# Patient Record
Sex: Female | Born: 1970
Health system: Southern US, Community
[De-identification: ages and names within clinical notes are randomized; demographics above are authoritative.]

## PROBLEM LIST (undated history)

## (undated) DIAGNOSIS — T7840XA Allergy, unspecified, initial encounter: Secondary | ICD-10-CM

## (undated) DIAGNOSIS — E041 Nontoxic single thyroid nodule: Secondary | ICD-10-CM

## (undated) DIAGNOSIS — J309 Allergic rhinitis, unspecified: Secondary | ICD-10-CM

## (undated) DIAGNOSIS — F988 Other specified behavioral and emotional disorders with onset usually occurring in childhood and adolescence: Secondary | ICD-10-CM

## (undated) DIAGNOSIS — M419 Scoliosis, unspecified: Secondary | ICD-10-CM

## (undated) HISTORY — DX: Allergy, unspecified, initial encounter: T78.40XA

## (undated) HISTORY — DX: Other specified behavioral and emotional disorders with onset usually occurring in childhood and adolescence: F98.8

## (undated) HISTORY — DX: Allergic rhinitis, unspecified: J30.9

## (undated) HISTORY — DX: Scoliosis, unspecified: M41.9

---
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
## 2002-08-02 ENCOUNTER — Other Ambulatory Visit: Admission: RE | Admit: 2002-08-02 | Discharge: 2002-08-02 | Payer: Self-pay | Admitting: Obstetrics and Gynecology

## 2003-02-23 ENCOUNTER — Inpatient Hospital Stay (HOSPITAL_COMMUNITY): Admission: AD | Admit: 2003-02-23 | Discharge: 2003-02-25 | Payer: Self-pay | Admitting: Obstetrics and Gynecology

## 2003-02-23 HISTORY — PX: OTHER SURGICAL HISTORY: SHX169

## 2003-03-30 ENCOUNTER — Other Ambulatory Visit: Admission: RE | Admit: 2003-03-30 | Discharge: 2003-03-30 | Payer: Self-pay | Admitting: Obstetrics and Gynecology

## 2004-07-13 ENCOUNTER — Other Ambulatory Visit: Admission: RE | Admit: 2004-07-13 | Discharge: 2004-07-13 | Payer: Self-pay | Admitting: Obstetrics and Gynecology

## 2004-12-19 ENCOUNTER — Ambulatory Visit: Payer: Self-pay | Admitting: Internal Medicine

## 2006-01-03 ENCOUNTER — Ambulatory Visit: Payer: Self-pay | Admitting: Internal Medicine

## 2006-01-03 LAB — CONVERTED CEMR LAB
Albumin: 4.4 g/dL (ref 3.5–5.2)
Basophils Absolute: 0 10*3/uL (ref 0.0–0.1)
Bilirubin Urine: NEGATIVE
CO2: 27 meq/L (ref 19–32)
Calcium: 9.6 mg/dL (ref 8.4–10.5)
Chloride: 106 meq/L (ref 96–112)
Cholesterol: 138 mg/dL (ref 0–200)
Creatinine, Ser: 0.8 mg/dL (ref 0.4–1.2)
Glomerular Filtration Rate, Af Am: 106 mL/min/{1.73_m2}
Glucose, Bld: 91 mg/dL (ref 70–99)
Hemoglobin: 14 g/dL (ref 12.0–15.0)
Leukocytes, UA: NEGATIVE
Lymphocytes Relative: 21.8 % (ref 12.0–46.0)
MCHC: 32.2 g/dL (ref 30.0–36.0)
MCV: 93.3 fL (ref 78.0–100.0)
Monocytes Absolute: 0.5 10*3/uL (ref 0.2–0.7)
Monocytes Relative: 8.4 % (ref 3.0–11.0)
Neutrophils Relative %: 68.5 % (ref 43.0–77.0)
Nitrite: NEGATIVE
Platelets: 257 10*3/uL (ref 150–400)
Specific Gravity, Urine: >=1.03 (ref 1.000–1.03)
TSH: 5.24 microintl units/mL (ref 0.35–5.50)
Total Protein, Urine: NEGATIVE mg/dL
Triglyceride fasting, serum: 68 mg/dL (ref 0–149)

## 2006-01-07 ENCOUNTER — Ambulatory Visit: Payer: Self-pay | Admitting: Internal Medicine

## 2010-03-17 ENCOUNTER — Emergency Department: Payer: Self-pay | Admitting: Emergency Medicine

## 2010-10-02 ENCOUNTER — Telehealth: Payer: Self-pay

## 2010-10-02 DIAGNOSIS — Z0000 Encounter for general adult medical examination without abnormal findings: Secondary | ICD-10-CM

## 2010-10-02 NOTE — Telephone Encounter (Signed)
Put order in for labs. 

## 2010-11-05 ENCOUNTER — Encounter: Payer: Self-pay | Admitting: Internal Medicine

## 2010-11-05 DIAGNOSIS — Z0000 Encounter for general adult medical examination without abnormal findings: Secondary | ICD-10-CM | POA: Insufficient documentation

## 2010-11-05 DIAGNOSIS — Z0001 Encounter for general adult medical examination with abnormal findings: Secondary | ICD-10-CM | POA: Insufficient documentation

## 2010-11-08 ENCOUNTER — Encounter: Payer: Self-pay | Admitting: Internal Medicine

## 2010-11-08 ENCOUNTER — Other Ambulatory Visit (INDEPENDENT_AMBULATORY_CARE_PROVIDER_SITE_OTHER): Payer: 59

## 2010-11-08 ENCOUNTER — Ambulatory Visit (INDEPENDENT_AMBULATORY_CARE_PROVIDER_SITE_OTHER): Payer: 59 | Admitting: Internal Medicine

## 2010-11-08 VITALS — BP 94/62 | HR 70 | Temp 99.1°F | Ht 61.0 in | Wt 148.2 lb

## 2010-11-08 DIAGNOSIS — Z23 Encounter for immunization: Secondary | ICD-10-CM

## 2010-11-08 DIAGNOSIS — Z0000 Encounter for general adult medical examination without abnormal findings: Secondary | ICD-10-CM

## 2010-11-08 DIAGNOSIS — J309 Allergic rhinitis, unspecified: Secondary | ICD-10-CM

## 2010-11-08 HISTORY — DX: Allergic rhinitis, unspecified: J30.9

## 2010-11-08 LAB — BASIC METABOLIC PANEL
Calcium: 9.2 mg/dL (ref 8.4–10.5)
Creatinine, Ser: 0.7 mg/dL (ref 0.4–1.2)
GFR: 102.36 mL/min (ref >60.00)
Glucose, Bld: 92 mg/dL (ref 70–99)
Sodium: 139 mEq/L (ref 135–145)

## 2010-11-08 LAB — URINALYSIS, ROUTINE W REFLEX MICROSCOPIC
Ketones, ur: NEGATIVE
Specific Gravity, Urine: >=1.03 (ref 1.000–1.030)
Total Protein, Urine: NEGATIVE
Urine Glucose: NEGATIVE
pH: 6 (ref 5.0–8.0)

## 2010-11-08 LAB — CBC WITH DIFFERENTIAL/PLATELET
Basophils Relative: 0.4 % (ref 0.0–3.0)
Eosinophils Relative: 1 % (ref 0.0–5.0)
HCT: 43.2 % (ref 36.0–46.0)
Hemoglobin: 14.3 g/dL (ref 12.0–15.0)
Lymphocytes Relative: 25.9 % (ref 12.0–46.0)
Lymphs Abs: 2 10*3/uL (ref 0.7–4.0)
Monocytes Relative: 7.8 % (ref 3.0–12.0)
Neutro Abs: 5 10*3/uL (ref 1.4–7.7)
RBC: 4.53 Mil/uL (ref 3.87–5.11)
RDW: 13.8 % (ref 11.5–14.6)
WBC: 7.7 10*3/uL (ref 4.5–10.5)

## 2010-11-08 LAB — HEPATIC FUNCTION PANEL
Albumin: 4.5 g/dL (ref 3.5–5.2)
Alkaline Phosphatase: 54 U/L (ref 39–117)
Total Protein: 7.7 g/dL (ref 6.0–8.3)

## 2010-11-08 LAB — LIPID PANEL
Cholesterol: 160 mg/dL (ref 0–200)
VLDL: 32 mg/dL (ref 0.0–40.0)

## 2010-11-08 MED ORDER — TETANUS-DIPHTH-ACELL PERTUSSIS 5-2.5-18.5 LF-MCG/0.5 IM SUSP
0.5000 mL | Freq: Once | INTRAMUSCULAR | Status: AC
  Administered 2010-11-08: 0.5 mL via INTRAMUSCULAR

## 2010-11-08 NOTE — Patient Instructions (Signed)
You had the tetanus shot today Please go to LAB in the Basement for the blood and/or urine tests to be done today Please call the phone number [phone] (the [employer] System) for results of testing in 2-3 days;  When calling, simply dial the number, and when prompted enter the MRN number above (the Medical Record Number) and the # key, then the message should start.

## 2010-11-08 NOTE — Progress Notes (Signed)
Subjective:    Patient ID: Chelsea Ortiz, female    DOB: [birth year], 40 y.o.   MRN: [id number]  HPI  Here for wellness and f/u;  Overall doing ok;  Pt denies CP, worsening SOB, DOE, wheezing, orthopnea, PND, worsening LE edema, palpitations, dizziness or syncope.  Pt denies neurological change such as new Headache, facial or extremity weakness.  Pt denies polydipsia, polyuria, or low sugar symptoms. Pt states overall good compliance with treatment and medications, good tolerability, and trying to follow lower cholesterol diet.  Pt denies worsening depressive symptoms, suicidal ideation or panic. No fever, wt loss, night sweats, loss of appetite, or other constitutional symptoms.  Pt states good ability with ADL's, low fall risk, home safety reviewed and adequate, no significant changes in hearing or vision, and occasionally active with exercise.  Has known scoliosis and some ongoing back pain.  Has gained some wt over the past 5 yrs.   Husband an boss suggested she might have ADD, and she agrees, and does not want meds.  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 No past medical history on file. Past Surgical History  Procedure Date  . Tonsillectomy 1982  . Child birth [birth year]/23[birth year]2003    reports that she has been smoking.  She does not have any smokeless tobacco history on file. She reports that she does not drink alcohol or use illicit drugs. family history includes Heart disease in her other and Hypertension in her other. Allergies  Allergen Reactions  . Penicillins     hives   No current outpatient prescriptions on file prior to visit.   Review of Systems Review of Systems  Constitutional: Negative for diaphoresis, activity change, appetite change and unexpected weight change.  HENT: Negative for hearing loss, ear pain, facial swelling, mouth sores and neck stiffness.   Eyes: Negative for  pain, redness and visual disturbance.  Respiratory: Negative for shortness of breath and wheezing.   Cardiovascular: Negative for chest pain and palpitations.  Gastrointestinal: Negative for diarrhea, blood in stool, abdominal distention and rectal pain.  Genitourinary: Negative for hematuria, flank pain and decreased urine volume.  Musculoskeletal: Negative for myalgias and joint swelling.  Skin: Negative for color change and wound.  Neurological: Negative for syncope and numbness.  Hematological: Negative for adenopathy.  Psychiatric/Behavioral: Negative for hallucinations, self-injury, decreased concentration and agitation.     Objective:   Physical Exam BP 94/62  Pulse 70  Temp(Src) 99.1 F (37.3 C) (Oral)  Ht 5\' 1"  (1.549 m)  Wt 148 lb 4 oz (67.246 kg)  BMI 28.01 kg/m2  SpO2 97% Physical Exam  VS noted Constitutional: Pt is oriented to person, place, and time. Appears well-developed and well-nourished.  HENT:  Head: Normocephalic and atraumatic.  Right Ear: External ear normal.  Left Ear: External ear normal.  Nose: Nose normal.  Mouth/Throat: Oropharynx is clear and moist.  Eyes: Conjunctivae and EOM are normal. Pupils are equal, round, and reactive to light.  Neck: Normal range of motion. Neck supple. No JVD present. No tracheal deviation present.  Cardiovascular: Normal rate, regular rhythm, normal heart sounds and intact distal pulses.   Pulmonary/Chest: Effort normal and breath sounds normal.  Abdominal: Soft. Bowel sounds are normal. There is no tenderness.  Musculoskeletal: Normal range of motion. Exhibits no edema.  Lymphadenopathy:  Has no cervical adenopathy.  Neurological: Pt is alert and oriented to person, place, and  time. Pt has normal reflexes. No cranial nerve deficit.  Skin: Skin is warm and dry. No rash noted.  Psychiatric:  Has  normal mood and affect. Behavior is normal.     Assessment & Plan:

## 2010-11-08 NOTE — Assessment & Plan Note (Signed)
Overall doing well, age appropriate education and counseling updated, referrals for preventative services and immunizations addressed, dietary and smoking counseling addressed, most recent labs and ECG reviewed.  I have personally reviewed and have noted: 1) the patient's medical and social history 2) The pt's use of alcohol, tobacco, and illicit drugs 3) The patient's current medications and supplements 4) Functional ability including ADL's, fall risk, home safety risk, hearing and visual impairment 5) Diet and physical activities 6) Evidence for depression or mood disorder 7) The patient's height, weight, and BMI have been recorded in the chart I have made referrals, and provided counseling and education based on review of the above Declines flu shot for now, ok for tetanus

## 2011-04-22 ENCOUNTER — Encounter: Payer: Self-pay | Admitting: Internal Medicine

## 2011-04-22 ENCOUNTER — Ambulatory Visit (INDEPENDENT_AMBULATORY_CARE_PROVIDER_SITE_OTHER): Payer: 59 | Admitting: Internal Medicine

## 2011-04-22 VITALS — BP 110/78 | HR 72 | Temp 99.1°F | Ht 61.0 in | Wt 151.2 lb

## 2011-04-22 DIAGNOSIS — M4120 Other idiopathic scoliosis, site unspecified: Secondary | ICD-10-CM

## 2011-04-22 DIAGNOSIS — M419 Scoliosis, unspecified: Secondary | ICD-10-CM

## 2011-04-22 DIAGNOSIS — F988 Other specified behavioral and emotional disorders with onset usually occurring in childhood and adolescence: Secondary | ICD-10-CM

## 2011-04-22 DIAGNOSIS — F411 Generalized anxiety disorder: Secondary | ICD-10-CM

## 2011-04-22 DIAGNOSIS — F419 Anxiety disorder, unspecified: Secondary | ICD-10-CM | POA: Insufficient documentation

## 2011-04-22 HISTORY — DX: Other specified behavioral and emotional disorders with onset usually occurring in childhood and adolescence: F98.8

## 2011-04-22 HISTORY — DX: Scoliosis, unspecified: M41.9

## 2011-04-22 MED ORDER — METHYLPHENIDATE HCL ER (OSM) 18 MG PO TBCR: 18.0000 mg | EXTENDED_RELEASE_TABLET | ORAL | Status: DC

## 2011-04-22 NOTE — Patient Instructions (Signed)
Take all new medications as prescribed Continue all other medications as before Please call or have the pharmacy call if you need further refills Please call if you would want to start the lexapro 10 mg per day

## 2011-04-22 NOTE — Assessment & Plan Note (Signed)
Mild to mod, declines counseling, to consider lexapro 10 qd,  to f/u any worsening symptoms or concerns

## 2011-04-22 NOTE — Progress Notes (Signed)
  Subjective:    Patient ID: Chelsea Ortiz, female    DOB: 06/30/1970, 41 y.o.   MRN: [id number]  HPI  Here to f/u with ongoing lifelong difficulty with attention and task completion, mild overall but to the point now that she finds it more and more difficult to keep at work, though also states it is not overly demanding or high stress, and not in trouble at work.  Denies worsening depressive symptoms, suicidal ideation, or panic, though has ongoing anxiety, at least mild as well.  Pt denies chest pain, increased sob or doe, wheezing, orthopnea, PND, increased LE swelling, palpitations, dizziness or syncope.  Pt denies new neurological symptoms such as new headache, or facial or extremity weakness or numbness   Pt denies polydipsia, polyuria.  Does also have mild ongoing pain to mid left back assoc with scoliosis, just wanted to mention, is fearful of becomiing more kyphoscolitic in the future like her mother but has not noticed increased pain, and no bowel or bladder change, fever, wt loss,  worsening LE pain/numbness/weakness, gait change or falls.  Past Medical History  Diagnosis Date  . Allergic rhinitis, cause unspecified 11/08/2010   Past Surgical History  Procedure Date  . Tonsillectomy 1982  . Child birth [birth year]2003    reports that she has been smoking.  She does not have any smokeless tobacco history on file. She reports that she does not drink alcohol or use illicit drugs. family history includes Heart disease in her other and Hypertension in her other. Allergies  Allergen Reactions  . Penicillins     hives   No current outpatient prescriptions on file prior to visit.   Review of Systems Review of Systems  Constitutional: Negative for diaphoresis and unexpected weight change.  HENT: Negative for drooling and tinnitus.   Eyes: Negative for photophobia and visual disturbance.  Respiratory: Negative for choking and stridor.   Gastrointestinal: Negative for vomiting and blood in  stool.  Genitourinary: Negative for hematuria and decreased urine volume.  Musculoskeletal: Negative for gait problem.      Objective:   Physical Exam BP 110/78  Pulse 72  Temp(Src) 99.1 F (37.3 C) (Oral)  Ht 5\' 1"  (1.549 m)  Wt 151 lb 4 oz (68.607 kg)  BMI 28.58 kg/m2  SpO2 96% Physical Exam  VS noted, not ill appearing Constitutional: Pt appears well-developed and well-nourished.  HENT: Head: Normocephalic.  Right Ear: External ear normal.  Left Ear: External ear normal.  Eyes: Conjunctivae and EOM are normal. Pupils are equal, round, and reactive to light.  Neck: Normal range of motion. Neck supple.  Cardiovascular: Normal rate and regular rhythm.   Pulmonary/Chest: Effort normal and breath sounds normal.  Neurological: Pt is alert. No cranial nerve deficit.  Skin: Skin is warm. No erythema.  Spine with leftward scoliiosis palpable mid thoracic, with some paravertebral tender without erythema, rash, swelling Psychiatric: Pt behavior is normal. Thought content normal.     Assessment & Plan:

## 2011-04-22 NOTE — Assessment & Plan Note (Signed)
Mild now, mild symptomatic, no specific tx, but if worsens I suggested referral to [facility]

## 2011-04-22 NOTE — Assessment & Plan Note (Signed)
Mild, for low dose concerta generic,  to f/u any worsening symptoms or concerns

## 2011-05-17 ENCOUNTER — Other Ambulatory Visit: Payer: Self-pay

## 2011-05-17 MED ORDER — METHYLPHENIDATE HCL ER (OSM) 18 MG PO TBCR: 18.0000 mg | EXTENDED_RELEASE_TABLET | ORAL | Status: DC

## 2011-05-17 NOTE — Telephone Encounter (Signed)
Not sure what name of med for refill is based on this note

## 2011-05-17 NOTE — Telephone Encounter (Signed)
Done hardcopy to robin  

## 2011-05-17 NOTE — Telephone Encounter (Signed)
Patient informed prescription is ready for pickup at front desk 

## 2011-05-17 NOTE — Telephone Encounter (Signed)
Patient is requesting a refill on concerta.

## 2011-05-21 ENCOUNTER — Telehealth: Payer: Self-pay

## 2011-05-21 NOTE — Telephone Encounter (Signed)
The patient has picked up her concerta prescription but has not yet filled. She would like to know if there is an alternative prescription that would not make her sex drive " non existence" as she stated. She can return the concerta prescription if alternative is written. Please advise as patient is concerned. Call back number is [phone]

## 2011-05-21 NOTE — Telephone Encounter (Signed)
I have reviewed again the side effect, and I confirmed that female sexual dysfunction is not one of reported prior side effects  Female sexual dysfunction is common, sometimes more than one cause, and very difficult to understand, much less treat  OK to take the concerta for now  Please consider seeing GYN for evaluation

## 2011-05-21 NOTE — Telephone Encounter (Signed)
Patient informed. 

## 2011-05-21 NOTE — Telephone Encounter (Signed)
Called left message to call back 

## 2011-06-24 ENCOUNTER — Other Ambulatory Visit: Payer: Self-pay

## 2011-06-24 NOTE — Telephone Encounter (Signed)
ok 

## 2011-06-24 NOTE — Telephone Encounter (Signed)
Patient requesting a refill on Concerta. Call [phone] when ready for pickup

## 2011-06-25 MED ORDER — METHYLPHENIDATE HCL ER (OSM) 18 MG PO TBCR: 18.0000 mg | EXTENDED_RELEASE_TABLET | ORAL | Status: DC

## 2011-06-25 NOTE — Telephone Encounter (Signed)
Called the patient informed prescription requested is ready for pickup at the front desk. 

## 2011-08-06 ENCOUNTER — Other Ambulatory Visit: Payer: Self-pay

## 2011-08-06 MED ORDER — METHYLPHENIDATE HCL ER (OSM) 18 MG PO TBCR: 18.0000 mg | EXTENDED_RELEASE_TABLET | ORAL | Status: DC

## 2011-08-06 NOTE — Telephone Encounter (Signed)
Patient informed to pickup prescription requested in cabnet at the front desk.

## 2011-08-06 NOTE — Telephone Encounter (Signed)
Done hardcopy to robin  

## 2011-09-09 ENCOUNTER — Other Ambulatory Visit: Payer: Self-pay

## 2011-09-09 MED ORDER — METHYLPHENIDATE HCL ER (OSM) 18 MG PO TBCR: 18.0000 mg | EXTENDED_RELEASE_TABLET | ORAL | Status: DC

## 2011-09-09 NOTE — Telephone Encounter (Signed)
Done hardcopy to robin  

## 2011-09-09 NOTE — Telephone Encounter (Signed)
Called the patient informed prescription requested is ready for pickup at the front desk. 

## 2011-10-14 ENCOUNTER — Telehealth: Payer: Self-pay | Admitting: Internal Medicine

## 2011-10-14 MED ORDER — METHYLPHENIDATE HCL ER (OSM) 18 MG PO TBCR: 18.0000 mg | EXTENDED_RELEASE_TABLET | ORAL | Status: DC

## 2011-10-14 NOTE — Telephone Encounter (Signed)
Done hardcopy to robin  

## 2011-10-14 NOTE — Telephone Encounter (Signed)
Pt needs refill on Concerta, call when ready to pick up

## 2011-10-15 NOTE — Telephone Encounter (Signed)
Called the patient left detailed message that prescription requested is ready for pickup at the front desk. 

## 2011-11-14 ENCOUNTER — Telehealth: Payer: Self-pay | Admitting: Internal Medicine

## 2011-11-14 NOTE — Telephone Encounter (Signed)
Patient is requesting refill on concerta, call when ready to pick up

## 2011-11-15 MED ORDER — METHYLPHENIDATE HCL ER (OSM) 18 MG PO TBCR: 18.0000 mg | EXTENDED_RELEASE_TABLET | ORAL | Status: DC

## 2011-11-15 NOTE — Telephone Encounter (Signed)
Called the patient left detailed message that prescription requested is ready for pickup at the front desk. 

## 2011-11-15 NOTE — Telephone Encounter (Signed)
Done hardcopy to robin  

## 2011-12-17 ENCOUNTER — Other Ambulatory Visit: Payer: Self-pay

## 2011-12-17 MED ORDER — METHYLPHENIDATE HCL ER (OSM) 18 MG PO TBCR: 18.0000 mg | EXTENDED_RELEASE_TABLET | ORAL | Status: DC

## 2011-12-17 NOTE — Telephone Encounter (Signed)
Called informed the patient to pickup hardcopy at the front desk. 

## 2011-12-17 NOTE — Telephone Encounter (Signed)
Done hardcopy to robin  

## 2012-01-20 ENCOUNTER — Other Ambulatory Visit: Payer: Self-pay | Admitting: *Deleted

## 2012-01-20 MED ORDER — METHYLPHENIDATE HCL ER (OSM) 18 MG PO TBCR: 18.0000 mg | EXTENDED_RELEASE_TABLET | ORAL | Status: DC

## 2012-01-20 NOTE — Telephone Encounter (Signed)
Done hardcopy to robin  

## 2012-01-20 NOTE — Telephone Encounter (Signed)
Called the patient left detailed message prescription requested is ready for pickup at the front desk.

## 2012-01-20 NOTE — Telephone Encounter (Signed)
Pt requesting refill of Concerta-last written 12/17/2011 #30 with 0 refills-please advise.

## 2012-02-20 ENCOUNTER — Ambulatory Visit (INDEPENDENT_AMBULATORY_CARE_PROVIDER_SITE_OTHER): Payer: 59 | Admitting: Internal Medicine

## 2012-02-20 ENCOUNTER — Encounter: Payer: Self-pay | Admitting: Internal Medicine

## 2012-02-20 VITALS — BP 108/80 | HR 84 | Temp 99.0°F | Ht 62.0 in | Wt 144.2 lb

## 2012-02-20 DIAGNOSIS — R21 Rash and other nonspecific skin eruption: Secondary | ICD-10-CM | POA: Insufficient documentation

## 2012-02-20 DIAGNOSIS — Z0000 Encounter for general adult medical examination without abnormal findings: Secondary | ICD-10-CM

## 2012-02-20 DIAGNOSIS — F988 Other specified behavioral and emotional disorders with onset usually occurring in childhood and adolescence: Secondary | ICD-10-CM

## 2012-02-20 MED ORDER — TRIAMCINOLONE ACETONIDE 0.1 % EX CREA: TOPICAL_CREAM | Freq: Two times a day (BID) | CUTANEOUS | Status: DC

## 2012-02-20 MED ORDER — AMPHETAMINE-DEXTROAMPHET ER 10 MG PO CP24: ORAL_CAPSULE | ORAL | Status: DC

## 2012-02-20 NOTE — Assessment & Plan Note (Signed)
Ok to try change adderall ER 20 bid

## 2012-02-20 NOTE — Progress Notes (Signed)
  Subjective:    Patient ID: Chelsea Ortiz, female    DOB: 1970-12-31, 42 y.o.   MRN: [id number]  HPI    Here with c/o concerta now increased over $60 per month, can no longer afford though working well for ADD, with good work and social function.  Also with itchy rash to distal right foot/middle toes that gets better and worse with itching and scratching, may have started 6 mo ago after a ? Tick bite between 2 of the toes.  No fever, red streaks, swelling or drainage Past Medical History  Diagnosis Date  . Allergic rhinitis, cause unspecified 11/08/2010  . ADD (attention deficit disorder) 04/22/2011  . Scoliosis of thoracic spine 04/22/2011   Past Surgical History  Procedure Date  . Tonsillectomy 1982  . Child birth [birth year]2003    reports that she has been smoking.  She does not have any smokeless tobacco history on file. She reports that she does not drink alcohol or use illicit drugs. family history includes Heart disease in her other and Hypertension in her other. Allergies  Allergen Reactions  . Penicillins     hives   Current Outpatient Prescriptions on File Prior to Visit  Medication Sig Dispense Refill  . amphetamine-dextroamphetamine (ADDERALL XR) 10 MG 24 hr capsule 1 tab by mouth twice per day at 8 am and noon  60 capsule  0  . methylphenidate (CONCERTA) 18 MG CR tablet Take 1 tablet (18 mg total) by mouth every morning.  30 tablet  0   Review of Systems All otherwise neg per pt     Objective:   Physical Exam BP 108/80  Pulse 84  Temp 99 F (37.2 C) (Oral)  Ht 5\' 2"  (1.575 m)  Wt 144 lb 4 oz (65.431 kg)  BMI 26.38 kg/m2  SpO2 99% VS noted,  Constitutional: Pt appears well-developed and well-nourished.  HENT: Head: NCAT.  Right Ear: External ear normal.  Left Ear: External ear normal.  Eyes: Conjunctivae and EOM are normal. Pupils are equal, round, and reactive to light.  Neck: Normal range of motion. Neck supple.  Cardiovascular: Normal rate and regular rhythm.     Pulmonary/Chest: Effort normal and breath sounds normal.  Neurological: Pt is alert. Not confused  Skin: with discrete area itchy plaque like rash to dorsal toes and distal foot Psychiatric: Pt behavior is normal. Thought content normal.     Assessment & Plan:

## 2012-02-20 NOTE — Assessment & Plan Note (Signed)
C/w dermatitis, for steroid cr prn,  to f/u any worsening symptoms or concerns

## 2012-02-20 NOTE — Patient Instructions (Addendum)
OK to stop the concerta Ok to start the Adderall ER 10 mg twice per day Please continue all other medications as before, and refills have been done if requested. Thank you for enrolling in MyChart. Please follow the instructions below to securely access your online medical record. MyChart allows you to send messages to your doctor, view your test results, renew your prescriptions, schedule appointments, and more. To Log into My Chart online, please go by [employer] or [employer] to [email or web address], or download the MyChart App from the [employer] of [employer] .  Your Username is: [email or web address] (pass [employer]) Please send a Engineer, water on Mychart later today.

## 2012-02-26 ENCOUNTER — Telehealth: Payer: Self-pay

## 2012-02-26 NOTE — Telephone Encounter (Signed)
Received PA Approval for Adderall XR  Until 02/25/13.  Pharmacy and patient informed.

## 2012-02-26 NOTE — Telephone Encounter (Signed)
A user error has taken place: encounter opened in error, closed for administrative reasons.

## 2012-03-12 ENCOUNTER — Ambulatory Visit (INDEPENDENT_AMBULATORY_CARE_PROVIDER_SITE_OTHER): Payer: 59 | Admitting: Internal Medicine

## 2012-03-12 ENCOUNTER — Encounter: Payer: Self-pay | Admitting: Internal Medicine

## 2012-03-12 VITALS — BP 106/70 | HR 97 | Temp 99.2°F | Ht 61.0 in | Wt 140.5 lb

## 2012-03-12 DIAGNOSIS — J309 Allergic rhinitis, unspecified: Secondary | ICD-10-CM

## 2012-03-12 DIAGNOSIS — J209 Acute bronchitis, unspecified: Secondary | ICD-10-CM | POA: Insufficient documentation

## 2012-03-12 DIAGNOSIS — R062 Wheezing: Secondary | ICD-10-CM | POA: Insufficient documentation

## 2012-03-12 MED ORDER — HYDROCODONE-HOMATROPINE 5-1.5 MG/5ML PO SYRP: 5.0000 mL | ORAL_SOLUTION | Freq: Four times a day (QID) | ORAL | Status: DC | PRN

## 2012-03-12 MED ORDER — PREDNISONE 10 MG PO TABS: ORAL_TABLET | ORAL | Status: DC

## 2012-03-12 MED ORDER — LEVOFLOXACIN 250 MG PO TABS: 250.0000 mg | ORAL_TABLET | Freq: Every day | ORAL | Status: DC

## 2012-03-12 NOTE — Assessment & Plan Note (Signed)
Mild, for low dose predpack asd,  to f/u any worsening symptoms or concerns

## 2012-03-12 NOTE — Assessment & Plan Note (Signed)
Mild, likely to improve with predpack asd,  to f/u any worsening symptoms or concerns

## 2012-03-12 NOTE — Assessment & Plan Note (Signed)
Mild to mod, for antibx course,  to f/u any worsening symptoms or concerns 

## 2012-03-12 NOTE — Progress Notes (Signed)
  Subjective:    Patient ID: Chelsea Ortiz, female    DOB: 11/15/1970, 42 y.o.   MRN: [id number]  HPI  Here with acute onset mild to mod 2-3 days ST, HA, general weakness and malaise, with prod cough greenish sputum, but Pt denies chest pain, increased sob or doe, wheezing, orthopnea, PND, increased LE swelling, palpitations, dizziness or syncope, except for onset mild wheezing/sob for 1-2 days.  Pt denies polydipsia, polyuria.  Does have several wks ongoing nasal allergy symptoms with clearish congestion, itch and sneezing, without fever, pain, ST, but overall mild.  Past Medical History  Diagnosis Date  . Allergic rhinitis, cause unspecified 11/08/2010  . ADD (attention deficit disorder) 04/22/2011  . Scoliosis of thoracic spine 04/22/2011   Past Surgical History  Procedure Date  . Tonsillectomy 1982  . Child birth [birth year]2003    reports that she has been smoking.  She does not have any smokeless tobacco history on file. She reports that she does not drink alcohol or use illicit drugs. family history includes Heart disease in her other and Hypertension in her other. Allergies  Allergen Reactions  . Penicillins     hives   Current Outpatient Prescriptions on File Prior to Visit  Medication Sig Dispense Refill  . amphetamine-dextroamphetamine (ADDERALL XR) 10 MG 24 hr capsule 1 tab by mouth twice per day at 8 am and noon  60 capsule  0  . methylphenidate (CONCERTA) 18 MG CR tablet Take 1 tablet (18 mg total) by mouth every morning.  30 tablet  0  . triamcinolone cream (KENALOG) 0.1 % Apply topically 2 (two) times daily.  30 g  0   Review of Systems  Constitutional: Negative for unexpected weight change, or unusual diaphoresis  HENT: Negative for tinnitus.   Eyes: Negative for photophobia and visual disturbance.  Respiratory: Negative for choking and stridor.   Gastrointestinal: Negative for vomiting and blood in stool.  Genitourinary: Negative for hematuria and decreased urine volume.   Musculoskeletal: Negative for acute joint swelling Skin: Negative for color change and wound.  Neurological: Negative for tremors and numbness other than noted  Psychiatric/Behavioral: Negative for decreased concentration or  hyperactivity.       Objective:   Physical Exam BP 106/70  Pulse 97  Temp 99.2 F (37.3 C) (Oral)  Ht 5\' 1"  (1.549 m)  Wt 140 lb 8 oz (63.73 kg)  BMI 26.55 kg/m2  SpO2 97% VS noted, mild ill Constitutional: Pt appears well-developed and well-nourished.  HENT: Head: NCAT.  Right Ear: External ear normal.  Left Ear: External ear normal. Bilat tm's with mild erythema.  Max sinus areas non tender.  Pharynx with mild erythema, no exudate  Eyes: Conjunctivae and EOM are normal. Pupils are equal, round, and reactive to light.  Neck: Normal range of motion. Neck supple.  Cardiovascular: Normal rate and regular rhythm.   Pulmonary/Chest: Effort normal and breath sounds mild decreased, few wheeze bilat Neurological: Pt is alert. Not confused  Skin: Skin is warm. No erythema.  Psychiatric: Pt behavior is normal. Thought content normal.     Assessment & Plan:

## 2012-03-12 NOTE — Patient Instructions (Addendum)
Please take all new medication as prescribed - the antibiotic, cough medicine, and prednisone Please continue all other medications as before

## 2012-03-21 ENCOUNTER — Other Ambulatory Visit: Payer: Self-pay

## 2012-03-23 ENCOUNTER — Ambulatory Visit (INDEPENDENT_AMBULATORY_CARE_PROVIDER_SITE_OTHER): Payer: 59 | Admitting: Internal Medicine

## 2012-03-23 ENCOUNTER — Encounter: Payer: Self-pay | Admitting: Internal Medicine

## 2012-03-23 ENCOUNTER — Ambulatory Visit (INDEPENDENT_AMBULATORY_CARE_PROVIDER_SITE_OTHER)
Admission: RE | Admit: 2012-03-23 | Discharge: 2012-03-23 | Disposition: A | Discharge status: Home / Self Care | Payer: 59 | Source: Ambulatory Visit | Attending: Internal Medicine | Admitting: Internal Medicine

## 2012-03-23 VITALS — BP 110/72 | HR 82 | Temp 98.9°F | Ht 61.0 in | Wt 142.0 lb

## 2012-03-23 DIAGNOSIS — R0602 Shortness of breath: Secondary | ICD-10-CM

## 2012-03-23 DIAGNOSIS — R05 Cough: Secondary | ICD-10-CM

## 2012-03-23 MED ORDER — AZITHROMYCIN 250 MG PO TABS: ORAL_TABLET | ORAL | Status: DC

## 2012-03-23 MED ORDER — PHENYLEPH-PROMETHAZINE-COD 5-6.25-10 MG/5ML PO SYRP: 5.0000 mL | ORAL_SOLUTION | Freq: Every evening | ORAL | Status: DC | PRN

## 2012-03-23 NOTE — Progress Notes (Signed)
HPI  Pt presents to the clinic today with c/o fatigue and cough x 2 weeks. She was seen by Dr. Jonny Ruiz on 03/12/12 and diagnosed with bronchitis. She was given Levaquin and cough syrup but is not any better. In fact the cough is worse. She is not getting any sleep at night. She has been running intermittent fevers. She is concerned that she may have pneumonia. She has had sick contacts.  Review of Systems      Past Medical History  Diagnosis Date  . Allergic rhinitis, cause unspecified 11/08/2010  . ADD (attention deficit disorder) 04/22/2011  . Scoliosis of thoracic spine 04/22/2011    Family History  Problem Relation Age of Onset  . Heart disease Other   . Hypertension Other     History   Social History  . Marital Status: Married    Spouse Name: N/A    Number of Children: N/A  . Years of Education: 14   Occupational History  . rental cord.    Social History Main Topics  . Smoking status: Current Every Day Smoker  . Smokeless tobacco: Not on file  . Alcohol Use: No  . Drug Use: Yes  . Sexually Active: Not on file   Other Topics Concern  . Not on file   Social History Narrative  . No narrative on file    Allergies  Allergen Reactions  . Penicillins     hives     Constitutional: Positive headache, fatigue and fever. Denies abrupt weight changes.  HEENT:  Positive sore throat. Denies eye redness, eye pain, pressure behind the eyes, facial pain, nasal congestion, ear pain, ringing in the ears, wax buildup, runny nose or bloody nose. Respiratory: Positive cough. Denies difficulty breathing or shortness of breath.  Cardiovascular: Denies chest pain, chest tightness, palpitations or swelling in the hands or feet.   No other specific complaints in a complete review of systems (except as listed in HPI above).  Objective:   BP 110/72  Pulse 82  Temp(Src) 98.9 F (37.2 C) (Oral)  Ht 5\' 1"  (1.549 m)  Wt 142 lb (64.411 kg)  BMI 26.84 kg/m2  SpO2 96% Wt Readings from  Last 3 Encounters:  03/23/12 142 lb (64.411 kg)  03/12/12 140 lb 8 oz (63.73 kg)  02/20/12 144 lb 4 oz (65.431 kg)     General: Appears her stated age, well developed, well nourished in NAD. HEENT: Head: normal shape and size; Eyes: sclera white, no icterus, conjunctiva pink, PERRLA and EOMs intact; Ears: Tm's gray and intact, normal light reflex; Nose: mucosa erythematous and moist, septum midline; Throat/Mouth: + PND. Teeth present, mucosa erythematous and moist, no exudate noted, no lesions or ulcerations noted.  Neck: Mild cervical lymphadenopathy. Neck supple, trachea midline. No massses, lumps or thyromegaly present.  Cardiovascular: Normal rate and rhythm. S1,S2 noted.  No murmur, rubs or gallops noted. No JVD or BLE edema. No carotid bruits noted. Pulmonary/Chest: Normal effort and positive vesicular breath sounds. Diminshed in the LLL. No respiratory distress. No wheezes, rales or ronchi noted.      Assessment & Plan:   Acute bronchitis, unresolved with additional workup required:  Will obtain chest xray to rule out pneumonia Get some rest and drink plenty of water Do salt water gargles for the sore throat eRx for Azithromax x 5 days eRx for phenergan with codeine cough syrup  RTC as needed or if symptoms persist.

## 2012-03-24 ENCOUNTER — Telehealth: Payer: Self-pay | Admitting: Internal Medicine

## 2012-03-24 MED ORDER — PHENYLEPH-PROMETHAZINE-COD 5-6.25-10 MG/5ML PO SYRP: 5.0000 mL | ORAL_SOLUTION | Freq: Every evening | ORAL | Status: DC | PRN

## 2012-03-24 NOTE — Addendum Note (Signed)
Addended by: Carin Primrose on: 03/24/2012 08:20 AM   Modules accepted: Orders

## 2012-03-24 NOTE — Telephone Encounter (Signed)
[facility] Triage Call Report Triage Record Num: [id number] Operator: Bennie Hind Patient Name: Chelsea Ortiz Call Date & Time: 03/23/2012 6:11:21PM Patient Phone: [phone] PCP: Patient Gender: Female PCP Fax : Patient DOB: 05/16/1970 Practice Name: [facility] Reason for Call: Caller: Raynelle Fanning; PCP: Nicki Reaper; CB#: [phone]; Call regarding ; 03-23-12 she told pharmacy when she came to pick up her prescriptions she was to have a cough medicine also. I looked in EPIC and saw the Rx and saw it was printed I gave verbal order to the pharmacist and she said she will call the patient and have her look through all her paperwork to make sure she doesn't have a Rx in there that she doesn't realize gave Raynelle Fanning pharmacist the order for Phenyleph-Promethazine-cod 5-6.25-10 mg/5 ml 5 ml as needed at bedtime 240 ml with no refills Protocol(s) Used: Office Note Recommended Outcome per Protocol: Information Noted and Sent to Office Reason for Outcome: Caller information to office

## 2012-03-26 ENCOUNTER — Telehealth: Payer: Self-pay | Admitting: *Deleted

## 2012-03-26 ENCOUNTER — Encounter: Payer: Self-pay | Admitting: Internal Medicine

## 2012-03-26 ENCOUNTER — Ambulatory Visit (INDEPENDENT_AMBULATORY_CARE_PROVIDER_SITE_OTHER): Payer: 59 | Admitting: Internal Medicine

## 2012-03-26 VITALS — BP 102/78 | HR 94 | Temp 98.5°F

## 2012-03-26 DIAGNOSIS — R079 Chest pain, unspecified: Secondary | ICD-10-CM

## 2012-03-26 DIAGNOSIS — R05 Cough: Secondary | ICD-10-CM

## 2012-03-26 MED ORDER — HYDROCODONE-ACETAMINOPHEN 5-325 MG PO TABS: 1.0000 | ORAL_TABLET | Freq: Three times a day (TID) | ORAL | Status: DC | PRN

## 2012-03-26 NOTE — Progress Notes (Signed)
HPI  Pt presents to the clinic today with c/o cold symptoms x 2 weeks. The worst part is the sore throat and dry cough.  She has failed azithromax, Levaquin as well as a steroid taper for presumed bronchitis. Chest xray has been negative for infiltrate. The cough is constant. She does have pain in her left ribs. She has taken cough syrup with codeine with no relief. She has had sick contacts.  Review of Systems      Past Medical History  Diagnosis Date  . Allergic rhinitis, cause unspecified 11/08/2010  . ADD (attention deficit disorder) 04/22/2011  . Scoliosis of thoracic spine 04/22/2011    Family History  Problem Relation Age of Onset  . Heart disease Other   . Hypertension Other     History   Social History  . Marital Status: Married    Spouse Name: N/A    Number of Children: N/A  . Years of Education: 14   Occupational History  . [employer].    Social History Main Topics  . Smoking status: Current Every Day Smoker  . Smokeless tobacco: Not on file  . Alcohol Use: No  . Drug Use: Yes  . Sexually Active: Not on file   Other Topics Concern  . Not on file   Social History Narrative  . No narrative on file    Allergies  Allergen Reactions  . Penicillins     hives     Constitutional: Positive headache, fatigue. Denies fever or abrupt weight changes.  HEENT:  Positive sore throat. Denies eye redness, eye pain, pressure behind the eyes, facial pain, nasal congestion, ear pain, ringing in the ears, wax buildup, runny nose or bloody nose. Respiratory: Positive cough. Denies difficulty breathing or shortness of breath.  Cardiovascular: Denies chest pain, chest tightness, palpitations or swelling in the hands or feet.   No other specific complaints in a complete review of systems (except as listed in HPI above).  Objective:   BP 102/78  Pulse 94  Temp(Src) 98.5 F (36.9 C) (Oral)  SpO2 94% Wt Readings from Last 3 Encounters:  03/23/12 142 lb (64.411 kg)   03/12/12 140 lb 8 oz (63.73 kg)  02/20/12 144 lb 4 oz (65.431 kg)     General: Appears her stated age, well developed, well nourished in NAD. HEENT: Head: normal shape and size; Eyes: sclera white, no icterus, conjunctiva pink, PERRLA and EOMs intact; Ears: Tm's gray and intact, normal light reflex; Nose: mucosa pink and moist, septum midline; Throat/Mouth: + PND. Teeth present, mucosa erythematous and moist, no exudate noted, no lesions or ulcerations noted.  Neck: Mild cervical lymphadenopathy. Neck supple, trachea midline. No massses, lumps or thyromegaly present.  Cardiovascular: Normal rate and rhythm. S1,S2 noted.  No murmur, rubs or gallops noted. No JVD or BLE edema. No carotid bruits noted. Pulmonary/Chest: Normal effort and positive vesicular breath sounds. Diminished LLL. No respiratory distress. No wheezes, rales or ronchi noted.      Assessment & Plan:   Cough with unclear etiology,  additional workup required:  CT chest to try to fine etiology of cough and pain eRx for [place] 5-325  For cough  RTC as needed or if symptoms persist.

## 2012-03-26 NOTE — Telephone Encounter (Signed)
[facility] is calling about prescription that NP was going to send in for pt for her cough.

## 2012-03-26 NOTE — Telephone Encounter (Signed)
Rx phoned in for Vicodin 5-325mg  per verbal order from [facility], NP-C.

## 2012-03-27 ENCOUNTER — Emergency Department: Payer: Self-pay | Admitting: Emergency Medicine

## 2012-09-01 ENCOUNTER — Telehealth: Payer: Self-pay | Admitting: *Deleted

## 2012-09-01 MED ORDER — AMPHETAMINE-DEXTROAMPHET ER 10 MG PO CP24: ORAL_CAPSULE | ORAL | Status: DC

## 2012-09-01 NOTE — Telephone Encounter (Signed)
Pt called requesting a refill of Adderall.  Last OV 2.20.2014.  Please advise

## 2012-09-01 NOTE — Telephone Encounter (Signed)
Called the patient informed to pickup hardcopy at the front desk. 

## 2012-09-01 NOTE — Telephone Encounter (Signed)
Done hardcopy to robin  

## 2012-12-10 ENCOUNTER — Other Ambulatory Visit: Payer: Self-pay

## 2014-02-21 ENCOUNTER — Other Ambulatory Visit: Payer: Self-pay | Admitting: Obstetrics and Gynecology

## 2014-02-21 DIAGNOSIS — R928 Other abnormal and inconclusive findings on diagnostic imaging of breast: Secondary | ICD-10-CM

## 2014-03-03 ENCOUNTER — Ambulatory Visit
Admission: RE | Admit: 2014-03-03 | Discharge: 2014-03-03 | Disposition: A | Discharge status: Home / Self Care | Payer: 59 | Source: Ambulatory Visit | Attending: Obstetrics and Gynecology | Admitting: Obstetrics and Gynecology

## 2014-03-03 DIAGNOSIS — R928 Other abnormal and inconclusive findings on diagnostic imaging of breast: Secondary | ICD-10-CM

## 2014-08-25 ENCOUNTER — Other Ambulatory Visit (INDEPENDENT_AMBULATORY_CARE_PROVIDER_SITE_OTHER): Payer: 59

## 2014-08-25 ENCOUNTER — Encounter: Payer: Self-pay | Admitting: Internal Medicine

## 2014-08-25 ENCOUNTER — Ambulatory Visit (INDEPENDENT_AMBULATORY_CARE_PROVIDER_SITE_OTHER): Payer: 59 | Admitting: Internal Medicine

## 2014-08-25 VITALS — BP 112/80 | HR 66 | Temp 99.2°F | Resp 12 | Ht 61.0 in | Wt 151.0 lb

## 2014-08-25 DIAGNOSIS — E049 Nontoxic goiter, unspecified: Secondary | ICD-10-CM | POA: Insufficient documentation

## 2014-08-25 DIAGNOSIS — E669 Obesity, unspecified: Secondary | ICD-10-CM | POA: Diagnosis not present

## 2014-08-25 DIAGNOSIS — Z0000 Encounter for general adult medical examination without abnormal findings: Secondary | ICD-10-CM

## 2014-08-25 DIAGNOSIS — M4124 Other idiopathic scoliosis, thoracic region: Secondary | ICD-10-CM | POA: Diagnosis not present

## 2014-08-25 DIAGNOSIS — R7989 Other specified abnormal findings of blood chemistry: Secondary | ICD-10-CM

## 2014-08-25 DIAGNOSIS — M419 Scoliosis, unspecified: Secondary | ICD-10-CM

## 2014-08-25 LAB — BASIC METABOLIC PANEL
BUN: 11 mg/dL (ref 6–23)
CO2: 28 meq/L (ref 19–32)
Calcium: 9.4 mg/dL (ref 8.4–10.5)
Chloride: 105 mEq/L (ref 96–112)
Creatinine, Ser: 0.69 mg/dL (ref 0.40–1.20)
GFR: 98.77 mL/min (ref >60.00)
GLUCOSE: 90 mg/dL (ref 70–99)
Potassium: 4.6 mEq/L (ref 3.5–5.1)
SODIUM: 139 meq/L (ref 135–145)

## 2014-08-25 LAB — CBC WITH DIFFERENTIAL/PLATELET
BASOS PCT: 1.9 % (ref 0.0–3.0)
Basophils Absolute: 0.1 10*3/uL (ref 0.0–0.1)
Eosinophils Absolute: 0.1 10*3/uL (ref 0.0–0.7)
Eosinophils Relative: 1 % (ref 0.0–5.0)
HEMATOCRIT: 41.8 % (ref 36.0–46.0)
Hemoglobin: 14 g/dL (ref 12.0–15.0)
LYMPHS ABS: 1.8 10*3/uL (ref 0.7–4.0)
LYMPHS PCT: 29.8 % (ref 12.0–46.0)
MCHC: 33.5 g/dL (ref 30.0–36.0)
MCV: 93.3 fl (ref 78.0–100.0)
Monocytes Absolute: 0.5 10*3/uL (ref 0.1–1.0)
Monocytes Relative: 8 % (ref 3.0–12.0)
NEUTROS ABS: 3.6 10*3/uL (ref 1.4–7.7)
Neutrophils Relative %: 59.3 % (ref 43.0–77.0)
Platelets: 287 10*3/uL (ref 150.0–400.0)
RBC: 4.48 Mil/uL (ref 3.87–5.11)
RDW: 13.3 % (ref 11.5–15.5)
WBC: 6.1 10*3/uL (ref 4.0–10.5)

## 2014-08-25 LAB — URINALYSIS, ROUTINE W REFLEX MICROSCOPIC
KETONES UR: NEGATIVE
LEUKOCYTES UA: NEGATIVE
Nitrite: NEGATIVE
UROBILINOGEN UA: 0.2 (ref 0.0–1.0)
Urine Glucose: NEGATIVE
pH: 5.5 (ref 5.0–8.0)

## 2014-08-25 LAB — HEPATIC FUNCTION PANEL
ALBUMIN: 4.4 g/dL (ref 3.5–5.2)
ALK PHOS: 55 U/L (ref 39–117)
ALT: 9 U/L (ref 0–35)
AST: 14 U/L (ref 0–37)
BILIRUBIN DIRECT: 0.1 mg/dL (ref 0.0–0.3)
BILIRUBIN TOTAL: 0.2 mg/dL (ref 0.2–1.2)
TOTAL PROTEIN: 7.1 g/dL (ref 6.0–8.3)

## 2014-08-25 LAB — LIPID PANEL
Cholesterol: 162 mg/dL (ref 0–200)
HDL: 32.8 mg/dL — AB (ref >39.00)
NONHDL: 129.2
TRIGLYCERIDES: 219 mg/dL — AB (ref 0.0–149.0)
Total CHOL/HDL Ratio: 5
VLDL: 43.8 mg/dL — AB (ref 0.0–40.0)

## 2014-08-25 LAB — LDL CHOLESTEROL, DIRECT: Direct LDL: 108 mg/dL

## 2014-08-25 LAB — TSH: TSH: 3.61 u[IU]/mL (ref 0.35–4.50)

## 2014-08-25 MED ORDER — PHENTERMINE HCL 37.5 MG PO CAPS: 37.5000 mg | ORAL_CAPSULE | ORAL | Status: DC

## 2014-08-25 NOTE — Assessment & Plan Note (Signed)
[place] for phentermine asd,  to f/u any worsening symptoms or concerns, limited rx only

## 2014-08-25 NOTE — Assessment & Plan Note (Signed)

## 2014-08-25 NOTE — Assessment & Plan Note (Signed)
Mild, possible multinodular, asympt, for TSH today, also thyroid u/s

## 2014-08-25 NOTE — Patient Instructions (Addendum)
Please take all new medication as prescribed  - the phentermine  Please continue all other medications as before  Please have the pharmacy call with any other refills you may need.  Please continue your efforts at being more active, low cholesterol diet, and weight control.  You are otherwise up to date with prevention measures today.  Please keep your appointments with your specialists as you may have planned  Please consider seeing Dr Tamala Julian in this office for the back pain  Please go to the LAB in the Basement (turn left off the elevator) for the tests to be done today  You will be contacted by phone if any changes need to be made immediately.  Otherwise, you will receive a letter about your results with an explanation, but please check with MyChart first.  Please remember to sign up for MyChart if you have not done so, as this will be important to you in the future with finding out test results, communicating by private email, and scheduling acute appointments online when needed.  Please return in 1 year for your yearly visit, or sooner if needed, with Lab testing done 3-5 days before

## 2014-08-25 NOTE — Progress Notes (Signed)
Pre visit review using our clinic review tool, if applicable. No additional management support is needed unless otherwise documented below in the visit note. 

## 2014-08-25 NOTE — Progress Notes (Signed)
   Subjective:    Patient ID: Chelsea Ortiz, female    DOB: [birth year], 44 y.o.   MRN: [id number]  HPI      Review of Systems     Objective:   Physical Exam BP 112/80 mmHg  Pulse 66  Temp(Src) 99.2 F (37.3 C) (Oral)  Resp 12  Ht 5\' 1"  (1.549 m)  Wt 151 lb (68.493 kg)  BMI 28.55 kg/m2  SpO2 98%      Assessment & Plan:

## 2014-08-25 NOTE — Assessment & Plan Note (Signed)
With worsening pain possibly related, I asked pt to conisder seeing Dr Smith/[facility]

## 2014-08-29 ENCOUNTER — Other Ambulatory Visit: Payer: Self-pay | Admitting: Obstetrics and Gynecology

## 2014-08-29 DIAGNOSIS — N6310 Unspecified lump in the right breast, unspecified quadrant: Secondary | ICD-10-CM

## 2014-09-08 ENCOUNTER — Ambulatory Visit
Admission: RE | Admit: 2014-09-08 | Discharge: 2014-09-08 | Disposition: A | Discharge status: Home / Self Care | Payer: 59 | Source: Ambulatory Visit | Attending: Obstetrics and Gynecology | Admitting: Obstetrics and Gynecology

## 2014-09-08 ENCOUNTER — Ambulatory Visit
Admission: RE | Admit: 2014-09-08 | Discharge: 2014-09-08 | Disposition: A | Discharge status: Home / Self Care | Payer: 59 | Source: Ambulatory Visit | Attending: Internal Medicine | Admitting: Internal Medicine

## 2014-09-08 DIAGNOSIS — N6310 Unspecified lump in the right breast, unspecified quadrant: Secondary | ICD-10-CM

## 2014-09-08 DIAGNOSIS — E049 Nontoxic goiter, unspecified: Secondary | ICD-10-CM

## 2014-09-09 ENCOUNTER — Other Ambulatory Visit: Payer: Self-pay | Admitting: Internal Medicine

## 2014-09-09 DIAGNOSIS — E042 Nontoxic multinodular goiter: Secondary | ICD-10-CM

## 2014-09-15 ENCOUNTER — Other Ambulatory Visit: Payer: Self-pay | Admitting: Internal Medicine

## 2014-09-15 DIAGNOSIS — E042 Nontoxic multinodular goiter: Secondary | ICD-10-CM

## 2014-09-22 ENCOUNTER — Other Ambulatory Visit (HOSPITAL_COMMUNITY)
Admission: RE | Admit: 2014-09-22 | Discharge: 2014-09-22 | Disposition: A | Discharge status: Home / Self Care | Payer: 59 | Source: Ambulatory Visit | Attending: Interventional Radiology | Admitting: Interventional Radiology

## 2014-09-22 ENCOUNTER — Ambulatory Visit
Admission: RE | Admit: 2014-09-22 | Discharge: 2014-09-22 | Disposition: A | Discharge status: Home / Self Care | Payer: 59 | Source: Ambulatory Visit | Attending: Internal Medicine | Admitting: Internal Medicine

## 2014-09-22 DIAGNOSIS — E042 Nontoxic multinodular goiter: Secondary | ICD-10-CM

## 2014-09-22 DIAGNOSIS — E041 Nontoxic single thyroid nodule: Secondary | ICD-10-CM | POA: Insufficient documentation

## 2014-09-29 ENCOUNTER — Telehealth: Payer: Self-pay | Admitting: Internal Medicine

## 2014-09-29 NOTE — Telephone Encounter (Signed)
Patient is calling for the results of her biopsy,no answer leave a message,

## 2014-09-29 NOTE — Telephone Encounter (Signed)
Please advise pt that result are not yet available but once they are, it will be released to MyChart for review

## 2014-09-29 NOTE — Telephone Encounter (Signed)
done

## 2014-10-06 ENCOUNTER — Ambulatory Visit: Payer: 59 | Admitting: Family Medicine

## 2014-10-11 ENCOUNTER — Telehealth: Payer: Self-pay | Admitting: Internal Medicine

## 2014-10-11 NOTE — Telephone Encounter (Signed)
Pt called in and wanted to know if Dr Jenny Reichmann has heard back from her tyroid test?  Can you all her when you get a chance about result

## 2014-10-12 NOTE — Telephone Encounter (Signed)
Pt called back in about these results.  Have you seen them?

## 2014-10-13 NOTE — Telephone Encounter (Signed)
Pt advised.

## 2014-10-13 NOTE — Telephone Encounter (Signed)
Sorry, I thinks she means thyroid biopsy done Sep 22, 2014  Biospy was NEGATIVE for malignancy, no further evaluation or treatment needed

## 2015-01-09 ENCOUNTER — Emergency Department
Admission: EM | Admit: 2015-01-09 | Discharge: 2015-01-09 | Disposition: A | Discharge status: Home / Self Care | Payer: 59 | Attending: Emergency Medicine | Admitting: Emergency Medicine

## 2015-01-09 ENCOUNTER — Emergency Department: Payer: 59

## 2015-01-09 ENCOUNTER — Encounter: Payer: Self-pay | Admitting: Emergency Medicine

## 2015-01-09 DIAGNOSIS — W1839XA Other fall on same level, initial encounter: Secondary | ICD-10-CM | POA: Insufficient documentation

## 2015-01-09 DIAGNOSIS — F17210 Nicotine dependence, cigarettes, uncomplicated: Secondary | ICD-10-CM | POA: Insufficient documentation

## 2015-01-09 DIAGNOSIS — Z880 Allergy status to penicillin: Secondary | ICD-10-CM | POA: Insufficient documentation

## 2015-01-09 DIAGNOSIS — S300XXA Contusion of lower back and pelvis, initial encounter: Secondary | ICD-10-CM | POA: Insufficient documentation

## 2015-01-09 DIAGNOSIS — Z3202 Encounter for pregnancy test, result negative: Secondary | ICD-10-CM | POA: Insufficient documentation

## 2015-01-09 DIAGNOSIS — S34139A Unspecified injury to sacral spinal cord, initial encounter: Secondary | ICD-10-CM | POA: Diagnosis present

## 2015-01-09 DIAGNOSIS — Y998 Other external cause status: Secondary | ICD-10-CM | POA: Insufficient documentation

## 2015-01-09 DIAGNOSIS — Y9289 Other specified places as the place of occurrence of the external cause: Secondary | ICD-10-CM | POA: Insufficient documentation

## 2015-01-09 DIAGNOSIS — Z79899 Other long term (current) drug therapy: Secondary | ICD-10-CM | POA: Insufficient documentation

## 2015-01-09 DIAGNOSIS — Y9389 Activity, other specified: Secondary | ICD-10-CM | POA: Diagnosis not present

## 2015-01-09 LAB — POCT PREGNANCY, URINE: PREG TEST UR: NEGATIVE

## 2015-01-09 MED ORDER — NAPROXEN 500 MG PO TABS: 500.0000 mg | ORAL_TABLET | Freq: Two times a day (BID) | ORAL | Status: DC

## 2015-01-09 MED ORDER — HYDROCODONE-ACETAMINOPHEN 5-325 MG PO TABS: 1.0000 | ORAL_TABLET | ORAL | Status: DC | PRN

## 2015-01-09 NOTE — ED Provider Notes (Signed)
[facility] Emergency Department Provider Note  ____________________________________________  Time seen: Approximately 7:55 AM  I have reviewed the triage vital signs and the nursing notes.   HISTORY  Chief Complaint Tailbone Pain  HPI MUSIQ BARAL is a 44 y.o. female is here with complaint of "tailbone pain". Patient states she fell last night due to water and landed directly on her buttocks. She denies any head injury or loss of consciousness. Patient states the pain is much worse this morning. She denies any paresthesias to her legs or back pain above her tailbone. She denies any previous injury.She states she is unable to sit due to pain. Currently she rates her pain as an 8 out of 10.   Past Medical History  Diagnosis Date  . Allergic rhinitis, cause unspecified 11/08/2010  . ADD (attention deficit disorder) 04/22/2011  . Scoliosis of thoracic spine 04/22/2011    Patient Active Problem List   Diagnosis Date Noted  . Obesity 08/25/2014  . Goiter 08/25/2014  . ADD (attention deficit disorder) 04/22/2011  . Anxiety 04/22/2011  . Scoliosis of thoracic spine 04/22/2011  . Allergic rhinitis, cause unspecified 11/08/2010  . Preventative health care 11/05/2010    Past Surgical History  Procedure Laterality Date  . Tonsillectomy  1982  . Child birth  02/23/2003    Current Outpatient Rx  Name  Route  Sig  Dispense  Refill  . HYDROcodone-acetaminophen (NORCO/VICODIN) 5-325 MG tablet   Oral   Take 1 tablet by mouth every 4 (four) hours as needed for moderate pain.   20 tablet   0   . naproxen (NAPROSYN) 500 MG tablet   Oral   Take 1 tablet (500 mg total) by mouth 2 (two) times daily with a meal.   30 tablet   0   . phentermine 37.5 MG capsule   Oral   Take 1 capsule (37.5 mg total) by mouth every morning.   30 capsule   2     Allergies Penicillins  Family History  Problem Relation Age of Onset  . Heart disease Other   . Hypertension  Other     Social History Social History  Substance Use Topics  . Smoking status: Current Every Day Smoker -- 0.50 packs/day    Types: Cigarettes  . Smokeless tobacco: None  . Alcohol Use: No    Review of Systems Constitutional: No fever/chills Eyes: No visual changes. ENT: No trauma Cardiovascular: Denies chest pain. Respiratory: Denies shortness of breath. Gastrointestinal:   No nausea, no vomiting.   Musculoskeletal: Positive coccyx pain Skin: Negative for rash. Neurological: Negative for headaches, focal weakness or numbness.  10-point ROS otherwise negative.  ____________________________________________   PHYSICAL EXAM:  VITAL SIGNS: ED Triage Vitals  Enc Vitals Group     BP --      Pulse --      Resp --      Temp --      Temp src --      SpO2 --      Weight --      Height --      Head Cir --      Peak Flow --      Pain Score 01/09/15 0753 8     Pain Loc --      Pain Edu? --      Excl. in [place]? --     Constitutional: Alert and oriented. Well appearing and in no acute distress. Eyes: Conjunctivae are normal. PERRL. EOMI.  Head: Atraumatic. Nose: No congestion/rhinnorhea. Neck: No stridor.  Nontender cervical spine. Range of motion without restriction. Cardiovascular: Normal rate, regular rhythm. Grossly normal heart sounds.  Good peripheral circulation. Respiratory: Normal respiratory effort.  No retractions. Lungs CTAB. Gastrointestinal: Soft and nontender. No distention. No abdominal bruits. No CVA tenderness. Musculoskeletal: Examination of the back feels show any gross abnormality. There is marked tenderness on palpation of the sacrum. No edema is present. Neurologic:  Normal speech and language. No gross focal neurologic deficits are appreciated. No gait instability. Skin:  Skin is warm, dry and intact. No rash noted. No ecchymosis, abrasions or erythema was present. Psychiatric: Mood and affect are normal. Speech and behavior are  normal.  ____________________________________________   LABS (all labs ordered are listed, but only abnormal results are displayed)  Labs Reviewed  POCT PREGNANCY, URINE    RADIOLOGY  Sacral and coccyx x-ray per radiologist shows no fracture per radiologist. ____________________________________________   PROCEDURES  Procedure(s) performed: None  Critical Care performed: No  ____________________________________________   INITIAL IMPRESSION / ASSESSMENT AND PLAN / ED COURSE  Pertinent labs & imaging results that were available during my care of the patient were reviewed by me and considered in my medical decision making (see chart for details).  Patient was encouraged to use pillows to sit until she is able to sit comfortably. Patient is given a prescription for Norco as needed for pain and naproxen twice a day with food. Patient will follow up with her PCP if any continued problems. ____________________________________________   FINAL CLINICAL IMPRESSION(S) / ED DIAGNOSES  Final diagnoses:  Contusion of coccyx, initial encounter      Johnn Hai, PA-C 01/09/15 [place], MD 01/10/15 [phone]

## 2015-01-09 NOTE — ED Notes (Signed)
Assessment per PA 

## 2015-01-09 NOTE — Discharge Instructions (Signed)
Contusion A contusion is a deep bruise. Contusions happen when an injury causes bleeding under the skin. Symptoms of bruising include pain, swelling, and discolored skin. The skin may turn blue, purple, or yellow. HOME CARE   Rest the injured area.  If told, put ice on the injured area.  Put ice in a plastic bag.  Place a towel between your skin and the bag.  Leave the ice on for 20 minutes, 2-3 times per day.  If told, put light pressure (compression) on the injured area using an elastic bandage. Make sure the bandage is not too tight. Remove it and put it back on as told by your doctor.  If possible, raise (elevate) the injured area above the level of your heart while you are sitting or lying down.  Take over-the-counter and prescription medicines only as told by your doctor. GET HELP IF:  Your symptoms do not get better after several days of treatment.  Your symptoms get worse.  You have trouble moving the injured area. GET HELP RIGHT AWAY IF:   You have very bad pain.  You have a loss of feeling (numbness) in a hand or foot.  Your hand or foot turns pale or cold.   This information is not intended to replace advice given to you by your health care provider. Make sure you discuss any questions you have with your health care provider.   Document Released: 07/10/2007 Document Revised: 10/12/2014 Document Reviewed: 06/08/2014 Elsevier Interactive Patient Education 2016 [employer]  Cryotherapy Cryotherapy is when you put ice on your injury. Ice helps lessen pain and puffiness (swelling) after an injury. Ice works the best when you start using it in the first 24 to 48 hours after an injury. HOME CARE  Put a dry or damp towel between the ice pack and your skin.  You may press gently on the ice pack.  Leave the ice on for no more than 10 to 20 minutes at a time.  Check your skin after 5 minutes to make sure your skin is okay.  Rest at least 20 minutes between ice  pack uses.  Stop using ice when your skin loses feeling (numbness).  Do not use ice on someone who cannot tell you when it hurts. This includes small children and people with memory problems (dementia). GET HELP RIGHT AWAY IF:  You have white spots on your skin.  Your skin turns blue or pale.  Your skin feels waxy or hard.  Your puffiness gets worse. MAKE SURE YOU:   Understand these instructions.  Will watch your condition.  Will get help right away if you are not doing well or get worse.   This information is not intended to replace advice given to you by your health care provider. Make sure you discuss any questions you have with your health care provider.   Document Released: 07/10/2007 Document Revised: 04/15/2011 Document Reviewed: 09/13/2010 Elsevier Interactive Patient Education 2016 [place] not take pain medication while driving.

## 2015-01-09 NOTE — ED Notes (Signed)
Fell last night and landed on tailbone, pain worse today

## 2015-06-14 ENCOUNTER — Ambulatory Visit (INDEPENDENT_AMBULATORY_CARE_PROVIDER_SITE_OTHER): Payer: Commercial Managed Care - HMO | Admitting: Internal Medicine

## 2015-06-14 ENCOUNTER — Encounter: Payer: Self-pay | Admitting: Internal Medicine

## 2015-06-14 VITALS — BP 122/74 | HR 72 | Temp 98.2°F | Resp 20 | Wt 149.0 lb

## 2015-06-14 DIAGNOSIS — F419 Anxiety disorder, unspecified: Secondary | ICD-10-CM

## 2015-06-14 DIAGNOSIS — Q899 Congenital malformation, unspecified: Secondary | ICD-10-CM

## 2015-06-14 DIAGNOSIS — Z0001 Encounter for general adult medical examination with abnormal findings: Secondary | ICD-10-CM

## 2015-06-14 DIAGNOSIS — F988 Other specified behavioral and emotional disorders with onset usually occurring in childhood and adolescence: Secondary | ICD-10-CM

## 2015-06-14 DIAGNOSIS — R6889 Other general symptoms and signs: Secondary | ICD-10-CM | POA: Diagnosis not present

## 2015-06-14 DIAGNOSIS — F909 Attention-deficit hyperactivity disorder, unspecified type: Secondary | ICD-10-CM

## 2015-06-14 MED ORDER — CLOTRIMAZOLE-BETAMETHASONE 1-0.05 % EX CREA: TOPICAL_CREAM | CUTANEOUS | Status: DC

## 2015-06-14 MED ORDER — ALPRAZOLAM 0.5 MG PO TABS
0.5000 mg | ORAL_TABLET | Freq: Two times a day (BID) | ORAL | Status: DC | PRN
Start: 2015-06-14 — End: 2015-10-16

## 2015-06-14 NOTE — Patient Instructions (Signed)
Please take all new medication as prescribed - the cream, and the xanax as needed  Please continue all other medications as before, and refills have been done if requested.  Please have the pharmacy call with any other refills you may need.  Please continue your efforts at being more active, low cholesterol diet, and weight control.  You are otherwise up to date with prevention measures today.  Please keep your appointments with your specialists as you may have planned  Please return in 1 year for your yearly visit, or sooner if needed, with Lab testing done 3-5 days before

## 2015-06-14 NOTE — Assessment & Plan Note (Signed)
Mild worsening situational, for xanax .5 bid prn,  to f/u any worsening symptoms or concerns j

## 2015-06-14 NOTE — Progress Notes (Signed)
Subjective:    Patient ID: Chelsea Ortiz, female    DOB: [birth year], 45 y.o.   MRN: [id number]  HPI   Here for wellness and f/u;  Overall doing ok;  Pt denies Chest pain, worsening SOB, DOE, wheezing, orthopnea, PND, worsening LE edema, palpitations, dizziness or syncope.  Pt denies neurological change such as new headache, facial or extremity weakness.  Pt denies polydipsia, polyuria, or low sugar symptoms. Pt states overall good compliance with treatment and medications, good tolerability, and has been trying to follow appropriate diet.  Pt denies worsening depressive symptoms, suicidal ideation or panic. No fever, night sweats, wt loss, loss of appetite, or other constitutional symptoms.  Pt states good ability with ADL's, has low fall risk, home safety reviewed and adequate, no other significant changes in hearing or vision, and only occasionally active with exercise.  Also with here with pain to bellybutton x 3 wks with itch, pain, has not seen tick , but has some drainage.  No fever, neosporin not working, nothing else makes better or worse.  Has not taken adderall a few yrs ago, did ok but does not want. Denies worsening depressive symptoms, suicidal ideation, or panic; has ongoing anxiety, increased recently, with falther's illness and in and out of hosp, she is the primary caregiver, seems him daily plus full time job, does not think she panics but gets prettey frazzled at times, ongoing her whole life.  Has tried lexapro in past but with sedation/sluggishness, did not take well.  Does not want further SSRI trial.  Past Medical History  Diagnosis Date  . Allergic rhinitis, cause unspecified 11/08/2010  . ADD (attention deficit disorder) 04/22/2011  . Scoliosis of thoracic spine 04/22/2011   Past Surgical History  Procedure Laterality Date  . Tonsillectomy  1982  . Child birth  02/23/2003    reports that she has been smoking Cigarettes.  She has been smoking about 0.50 packs per day. She does  not have any smokeless tobacco history on file. She reports that she uses illicit drugs. She reports that she does not drink alcohol. family history includes Heart disease in her other; Hypertension in her other. Allergies  Allergen Reactions  . Penicillins     hives   Current Outpatient Prescriptions on File Prior to Visit  Medication Sig Dispense Refill  . HYDROcodone-acetaminophen (NORCO/VICODIN) 5-325 MG tablet Take 1 tablet by mouth every 4 (four) hours as needed for moderate pain. (Patient not taking: Reported on 06/14/2015) 20 tablet 0  . naproxen (NAPROSYN) 500 MG tablet Take 1 tablet (500 mg total) by mouth 2 (two) times daily with a meal. (Patient not taking: Reported on 06/14/2015) 30 tablet 0  . phentermine 37.5 MG capsule Take 1 capsule (37.5 mg total) by mouth every morning. (Patient not taking: Reported on 06/14/2015) 30 capsule 2   No current facility-administered medications on file prior to visit.   Review of Systems Constitutional: Negative for increased diaphoresis, or other activity, appetite or siginficant weight change other than noted HENT: Negative for worsening hearing loss, ear pain, facial swelling, mouth sores and neck stiffness.   Eyes: Negative for other worsening pain, redness or visual disturbance.  Respiratory: Negative for choking or stridor Cardiovascular: Negative for other chest pain and palpitations.  Gastrointestinal: Negative for worsening diarrhea, blood in stool, or abdominal distention Genitourinary: Negative for hematuria, flank pain or change in urine volume.  Musculoskeletal: Negative for myalgias or other joint complaints.  Skin: Negative for other color change and wound  or drainage.  Neurological: Negative for syncope and numbness. other than noted Hematological: Negative for adenopathy. or other swelling Psychiatric/Behavioral: Negative for hallucinations, SI, self-injury, decreased concentration or other worsening agitation.        Objective:   Physical Exam BP 122/74 mmHg  Pulse 72  Temp(Src) 98.2 F (36.8 C) (Oral)  Resp 20  Wt 149 lb (67.586 kg)  SpO2 96% VS noted,  Constitutional: Pt is oriented to person, place, and time. Appears well-developed and well-nourished, in no significant distress Head: Normocephalic and atraumatic  Eyes: Conjunctivae and EOM are normal. Pupils are equal, round, and reactive to light Right Ear: External ear normal.  Left Ear: External ear normal Nose: Nose normal.  Mouth/Throat: Oropharynx is clear and moist  Neck: Normal range of motion. Neck supple. No JVD present. No tracheal deviation present or significant neck LA or mass Cardiovascular: Normal rate, regular rhythm, normal heart sounds and intact distal pulses.   Pulmonary/Chest: Effort normal and breath sounds without rales or wheezing  Abdominal: Soft. Bowel sounds are normal. NT. No HSM  Musculoskeletal: Normal range of motion. Exhibits no edema Lymphadenopathy: Has no cervical adenopathy.  Neurological: Pt is alert and oriented to person, place, and time. Pt has normal reflexes. No cranial nerve deficit. Motor grossly intact Skin: Skin is warm and dry. No rash noted or new ulcers, umbilical with marked pruritic erythema, slightly scaly, non swelling, slight d/c noted Psychiatric:  Has normal mood and affect. Behavior is normal.   Lab Results  Component Value Date   WBC 6.1 08/25/2014   HGB 14.0 08/25/2014   HCT 41.8 08/25/2014   PLT 287.0 08/25/2014   GLUCOSE 90 08/25/2014   CHOL 162 08/25/2014   TRIG 219.0* 08/25/2014   HDL 32.80* 08/25/2014   LDLDIRECT 108.0 08/25/2014   LDLCALC 91 11/08/2010   ALT 9 08/25/2014   AST 14 08/25/2014   NA 139 08/25/2014   K 4.6 08/25/2014   CL 105 08/25/2014   CREATININE 0.69 08/25/2014   BUN 11 08/25/2014   CO2 28 08/25/2014   TSH 3.61 08/25/2014       Assessment & Plan:

## 2015-06-14 NOTE — Progress Notes (Signed)
Pre visit review using our clinic review tool, if applicable. No additional management support is needed unless otherwise documented below in the visit note. 

## 2015-06-14 NOTE — Assessment & Plan Note (Signed)
C/w prob fungal etiology, for lotrisone cream prn,  to f/u any worsening symptoms or concerns

## 2015-06-14 NOTE — Assessment & Plan Note (Signed)

## 2015-06-14 NOTE — Assessment & Plan Note (Signed)
Mild to mod, declines further tx at this time,  to f/u any worsening symptoms or concerns

## 2015-07-31 ENCOUNTER — Telehealth: Payer: Self-pay | Admitting: Internal Medicine

## 2015-07-31 MED ORDER — PERMETHRIN 5 % EX CREA: 1.0000 "application " | TOPICAL_CREAM | Freq: Once | CUTANEOUS | Status: DC

## 2015-07-31 NOTE — Telephone Encounter (Signed)
Patient called and stated that her daughter had scabies about a month ago and doctor Jenny Reichmann prescribed her a cream to use for it. Now the patient is starting to itch in places like her daughter did and wanted to know if she could have something called in for her. Dr. Jenny Reichmann is out of the office until tomorrow, she would [facility] [place]

## 2015-07-31 NOTE — Telephone Encounter (Signed)
Medication sent to pharmacy  

## 2015-07-31 NOTE — Telephone Encounter (Signed)
Pt called in and has some questions about a cream that she used that is making her itch.  She would not go into detail as to what kind of cream with was.  Can you call her when you get a chance  Best number [phone]

## 2015-08-01 NOTE — Telephone Encounter (Signed)
Patient called and I informed her of the notes.

## 2015-08-29 ENCOUNTER — Encounter: Payer: 59 | Admitting: Internal Medicine

## 2015-09-01 ENCOUNTER — Encounter: Payer: Self-pay | Admitting: Family

## 2015-09-01 ENCOUNTER — Ambulatory Visit (INDEPENDENT_AMBULATORY_CARE_PROVIDER_SITE_OTHER): Payer: Commercial Managed Care - HMO | Admitting: Family

## 2015-09-01 ENCOUNTER — Telehealth: Payer: Self-pay | Admitting: Internal Medicine

## 2015-09-01 DIAGNOSIS — R21 Rash and other nonspecific skin eruption: Secondary | ICD-10-CM | POA: Diagnosis not present

## 2015-09-01 MED ORDER — PERMETHRIN 5 % EX CREA: 1.0000 "application " | TOPICAL_CREAM | Freq: Once | CUTANEOUS | 0 refills | Status: AC

## 2015-09-01 MED ORDER — TRIAMCINOLONE ACETONIDE 0.1 % EX OINT
1.0000 | TOPICAL_OINTMENT | Freq: Two times a day (BID) | CUTANEOUS | 1 refills | Status: DC
Start: 2015-09-01 — End: 2017-02-25

## 2015-09-01 NOTE — Patient Instructions (Signed)
Thank you for choosing [facility].  Summary/Instructions:  Your prescription(s) have been submitted to your pharmacy or been printed and provided for you. Please take as directed and contact our office if you believe you are having problem(s) with the medication(s) or have any questions.  If your symptoms worsen or fail to improve, please contact our office for further instruction, or in case of emergency go directly to the emergency room at the closest medical facility.     Scabies, Adult Scabies is a skin condition that happens when very small insects get under the skin (infestation). This causes a rash and severe itchiness. Scabies can spread from person to person (is contagious). If you get scabies, it is common for others in your household to get scabies too. With proper treatment, symptoms usually go away in 2-4 weeks. Scabies usually does not cause lasting problems. CAUSES This condition is caused by mites (Sarcoptes scabiei, or human itch mites) that can only be seen with a microscope. The mites get into the top layer of skin and lay eggs. Scabies can spread from person to person through:  Close contact with a person who has scabies.  Contact with infested items, such as towels, bedding, or clothing. RISK FACTORS This condition is more likely to develop in:  People who live in nursing homes and other extended-care facilities.  People who have sexual contact with a partner who has scabies.  Young children who attend child care facilities.  People who care for others who are at increased risk for scabies. SYMPTOMS Symptoms of this condition may include:  Severe itchiness. This is often worse at night.  A rash that includes tiny red bumps or blisters. The rash commonly occurs on the wrist, elbow, armpit, fingers, waist, groin, or buttocks. Bumps may form a line (burrow) in some areas.  Skin irritation. This can include scaly patches or sores. DIAGNOSIS This  condition is diagnosed with a physical exam. Your health care provider will look closely at your skin. In some cases, your health care provider may take a sample of your affected skin (skin scraping) and have it examined under a microscope. TREATMENT This condition may be treated with:  Medicated cream or lotion that kills the mites. This is spread on the entire body and left on for several hours. Usually, one treatment with medicated cream or lotion is enough to kill all of the mites. In severe cases, the treatment may be repeated.  Medicated cream that relieves itching.  Medicines that help to relieve itching.  Medicines that kill the mites. This treatment is rarely used. HOME CARE INSTRUCTIONS Medicines  Take or apply over-the-counter and prescription medicines as told by your health care provider.  Apply medicated cream or lotion as told by your health care provider.  Do not wash off the medicated cream or lotion until the necessary amount of time has passed. Skin Care  Avoid scratching your affected skin.  Keep your fingernails closely trimmed to reduce injury from scratching.  Take cool baths or apply cool washcloths to help reduce itching. General Instructions  Clean all items that you recently had contact with, including bedding, clothing, and furniture. Do this on the same day that your treatment starts.  Use hot water when you wash items.  Place unwashable items into closed, airtight plastic bags for at least 3 days. The mites cannot live for more than 3 days away from human skin.  Vacuum furniture and mattresses that you use.  Make sure that other  people who may have been infested are examined by a health care provider. These include members of your household and anyone who may have had contact with infested items.  Keep all follow-up visits as told by your health care provider. This is important. SEEK MEDICAL CARE IF:  You have itching that does not go away after  4 weeks of treatment.  You continue to develop new bumps or burrows.  You have redness, swelling, or pain in your rash area after treatment.  You have fluid, blood, or pus coming from your rash.   This information is not intended to replace advice given to you by your health care provider. Make sure you discuss any questions you have with your health care provider.   Document Released: 10/12/2014 Document Reviewed: 08/23/2014 Elsevier Interactive Patient Education [employer].

## 2015-09-01 NOTE — Progress Notes (Signed)
Subjective:    Patient ID: Chelsea Ortiz, female    DOB: Aug 16, 1970, 45 y.o.   MRN: [id number]  Chief Complaint  Patient presents with  . Rash    daughter was diagnosed with scabies, itching and rash on stomach    HPI:  Chelsea Ortiz is a 45 y.o. female who  has a past medical history of ADD (attention deficit disorder) (04/22/2011); Allergic rhinitis, cause unspecified (11/08/2010); and Scoliosis of thoracic spine (04/22/2011). and presents today For an acute office visit.  This is a new problem. Associated symptom of a rash located on her chest/stomach that has been going on for a couple of days. Described as red and itchy. May have spread. No modifying factors/treatments attempted. Notes her daughter was recently diagnosed with scabies.  Allergies  Allergen Reactions  . Penicillins     hives     Current Outpatient Prescriptions on File Prior to Visit  Medication Sig Dispense Refill  . ALPRAZolam (XANAX) 0.5 MG tablet Take 1 tablet (0.5 mg total) by mouth 2 (two) times daily as needed for anxiety. 60 tablet 2   No current facility-administered medications on file prior to visit.     Review of Systems  Constitutional: Negative for chills and fever.  Skin: Positive for rash.      Objective:    BP 110/70   Pulse 78   Temp 98 F (36.7 C) (Oral)   Resp 16   Ht 5\' 1"  (1.549 m)   Wt 149 lb (67.6 kg)   SpO2 96%   BMI 28.15 kg/m  Nursing note and vital signs reviewed.  Physical Exam  Constitutional: She is oriented to person, place, and time. She appears well-developed and well-nourished. No distress.  Cardiovascular: Normal rate, regular rhythm, normal heart sounds and intact distal pulses.   Pulmonary/Chest: Effort normal and breath sounds normal.  Neurological: She is alert and oriented to person, place, and time.  Skin: Skin is warm and dry. Rash ( maculopapular rash diffusely spread underneath her bilateral breasts and between. Redness without discharge.) noted.    Psychiatric: She has a normal mood and affect. Her behavior is normal. Judgment and thought content normal.       Assessment & Plan:   Problem List Items Addressed This Visit      Musculoskeletal and Integument   Rash and nonspecific skin eruption    Maculopapular rash located under and between her breasts with concern for scabies. Start permethrin. Start triamcinolone as needed for itching. Follow-up if symptoms worsen or do not improve.      Relevant Medications   permethrin (ELIMITE) 5 % cream   triamcinolone ointment (KENALOG) 0.1 %    Other Visit Diagnoses   None.      I have discontinued Ms. Drummer's clotrimazole-betamethasone. I am also having her start on triamcinolone ointment. Additionally, I am having her maintain her ALPRAZolam and permethrin.   Meds ordered this encounter  Medications  . permethrin (ELIMITE) 5 % cream    Sig: Apply 1 application topically once. Apply to all areas of the body below the neck and wash off after 8-14 hours.    Dispense:  60 g    Refill:  0    Order Specific Question:   Supervising Provider    Answer:   Pricilla Holm A [id number]  . triamcinolone ointment (KENALOG) 0.1 %    Sig: Apply 1 application topically 2 (two) times daily.    Dispense:  30 g  Refill:  1    Order Specific Question:   Supervising Provider    Answer:   Pricilla Holm A [id number]     Follow-up: Return if symptoms worsen or fail to improve.  Mauricio Po, FNP

## 2015-09-01 NOTE — Telephone Encounter (Signed)
Patient Name: Chelsea Ortiz DOB: 10/10/1970 Initial Comment caller states she has a rash on various parts of body that itch Nurse Assessment Nurse: Ronnald Ramp, RN, Miranda Date/Time (Eastern Time): 09/01/2015 8:45:02 AM Confirm and document reason for call. If symptomatic, describe symptoms. You must click the next button to save text entered. ---Caller states she has a scattered itchy rash. She was treated about 1 month ago for scabies. Has the patient traveled out of the country within the last 30 days? ---Not Applicable Does the patient have any new or worsening symptoms? ---Yes Will a triage be completed? ---Yes Related visit to physician within the last 2 weeks? ---No Does the PT have any chronic conditions? (i.e. diabetes, asthma, etc.) ---Yes List chronic conditions. ---Anxiety Is the patient pregnant or possibly pregnant? (Ask all females between the ages of 4-55) ---No Is this a behavioral health or substance abuse call? ---No Guidelines Guideline Title Affirmed Question Affirmed Notes Rash or Redness - Widespread SEVERE itching (i.e., interferes with sleep, normal activities or school) Final Disposition User See Physician within 24 Hours Ronnald Ramp, RN, Miranda Comments NO appt avialable with PCP. Appt scheduled with Dr. Elna Breslow for 3:15p Referrals REFERRED TO PCP OFFICE Disagree/Comply: Comply

## 2015-09-01 NOTE — Assessment & Plan Note (Signed)
Maculopapular rash located under and between her breasts with concern for scabies. Start permethrin. Start triamcinolone as needed for itching. Follow-up if symptoms worsen or do not improve.

## 2015-10-16 ENCOUNTER — Other Ambulatory Visit: Payer: Self-pay | Admitting: Internal Medicine

## 2015-10-17 NOTE — Telephone Encounter (Signed)
faxed

## 2015-10-17 NOTE — Telephone Encounter (Signed)
Done hardcopy to [name]  

## 2015-10-24 ENCOUNTER — Encounter: Payer: Self-pay | Admitting: Internal Medicine

## 2015-10-24 ENCOUNTER — Ambulatory Visit (INDEPENDENT_AMBULATORY_CARE_PROVIDER_SITE_OTHER): Payer: Commercial Managed Care - HMO | Admitting: Internal Medicine

## 2015-10-24 VITALS — BP 128/76 | HR 100 | Temp 98.7°F | Resp 20 | Wt 151.0 lb

## 2015-10-24 DIAGNOSIS — Z0001 Encounter for general adult medical examination with abnormal findings: Secondary | ICD-10-CM | POA: Diagnosis not present

## 2015-10-24 DIAGNOSIS — F419 Anxiety disorder, unspecified: Secondary | ICD-10-CM

## 2015-10-24 DIAGNOSIS — F909 Attention-deficit hyperactivity disorder, unspecified type: Secondary | ICD-10-CM

## 2015-10-24 DIAGNOSIS — R6889 Other general symptoms and signs: Secondary | ICD-10-CM | POA: Diagnosis not present

## 2015-10-24 DIAGNOSIS — F988 Other specified behavioral and emotional disorders with onset usually occurring in childhood and adolescence: Secondary | ICD-10-CM

## 2015-10-24 MED ORDER — AMPHETAMINE-DEXTROAMPHET ER 10 MG PO CP24: 10.0000 mg | ORAL_CAPSULE | Freq: Two times a day (BID) | ORAL | 0 refills | Status: DC

## 2015-10-24 MED ORDER — ALPRAZOLAM 0.5 MG PO TABS: ORAL_TABLET | ORAL | 5 refills | Status: DC

## 2015-10-24 NOTE — Assessment & Plan Note (Signed)
stable overall by history and exam, recent data reviewed with pt, and pt to continue medical treatment as before,  to f/u any worsening symptoms or concerns Lab Results  Component Value Date   WBC 6.1 08/25/2014   HGB 14.0 08/25/2014   HCT 41.8 08/25/2014   PLT 287.0 08/25/2014   GLUCOSE 90 08/25/2014   CHOL 162 08/25/2014   TRIG 219.0 (H) 08/25/2014   HDL 32.80 (L) 08/25/2014   LDLDIRECT 108.0 08/25/2014   LDLCALC 91 11/08/2010   ALT 9 08/25/2014   AST 14 08/25/2014   NA 139 08/25/2014   K 4.6 08/25/2014   CL 105 08/25/2014   CREATININE 0.69 08/25/2014   BUN 11 08/25/2014   CO2 28 08/25/2014   TSH 3.61 08/25/2014

## 2015-10-24 NOTE — Progress Notes (Signed)
   Subjective:    Patient ID: Chelsea Ortiz, female    DOB: 03-Sep-1970, 45 y.o.   MRN: [id number]  HPI Here to f/u; overall doing ok,  Pt denies chest pain, increasing sob or doe, wheezing, orthopnea, PND, increased LE swelling, palpitations, dizziness or syncope.  Pt denies new neurological symptoms such as new headache, or facial or extremity weakness or numbness.  Pt denies polydipsia, polyuria, or low sugar episode.   Pt denies new neurological symptoms such as new headache, or facial or extremity weakness or numbness.   Pt states overall good compliance with meds, mostly trying to follow appropriate diet, with wt overall stable.  ADD med working well, part Journalist, [employer], able to function well, asks for refills.  Denies worsening depressive symptoms, suicidal ideation, or panic; has ongoing anxiety, not increased recently. Past Medical History:  Diagnosis Date  . ADD (attention deficit disorder) 04/22/2011  . Allergic rhinitis, cause unspecified 11/08/2010  . Scoliosis of thoracic spine 04/22/2011   Past Surgical History:  Procedure Laterality Date  . child birth  02/23/2003  . TONSILLECTOMY  1982    reports that she has been smoking Cigarettes.  She has been smoking about 0.50 packs per day. She does not have any smokeless tobacco history on file. She reports that she uses drugs. She reports that she does not drink alcohol. family history includes Heart disease in her other; Hypertension in her other. Allergies  Allergen Reactions  . Penicillins     hives   Current Outpatient Prescriptions on File Prior to Visit  Medication Sig Dispense Refill  . triamcinolone ointment (KENALOG) 0.1 % Apply 1 application topically 2 (two) times daily. 30 g 1   No current facility-administered medications on file prior to visit.    Review of Systems  Constitutional: Negative for unusual diaphoresis or night sweats HENT: Negative for ear swelling or discharge Eyes:  Negative for worsening visual haziness  Respiratory: Negative for choking and stridor.   Gastrointestinal: Negative for distension or worsening eructation Genitourinary: Negative for retention or change in urine volume.  Musculoskeletal: Negative for other MSK pain or swelling Skin: Negative for color change and worsening wound Neurological: Negative for tremors and numbness other than noted  Psychiatric/Behavioral: Negative for decreased concentration or agitation other than above       Objective:   Physical Exam BP 128/76   Pulse 100   Temp 98.7 F (37.1 C) (Oral)   Resp 20   Wt 151 lb (68.5 kg)   SpO2 97%   BMI 28.53 kg/m  VS noted,  Constitutional: Pt appears in no apparent distress HENT: Head: NCAT.  Right Ear: External ear normal.  Left Ear: External ear normal.  Eyes: . Pupils are equal, round, and reactive to light. Conjunctivae and EOM are normal Neck: Normal range of motion. Neck supple.  Cardiovascular: Normal rate and regular rhythm.   Pulmonary/Chest: Effort normal and breath sounds without rales or wheezing.  Neurological: Pt is alert. Not confused , motor grossly intact Skin: Skin is warm. No rash, no LE edema Psychiatric: Pt behavior is normal. No agitation. mild nervous        Assessment & Plan:

## 2015-10-24 NOTE — Progress Notes (Signed)
Pre visit review using our clinic review tool, if applicable. No additional management support is needed unless otherwise documented below in the visit note. 

## 2015-10-24 NOTE — Assessment & Plan Note (Signed)
stable overall by history and exam, recent data reviewed with pt, and pt to continue medical treatment as before,  to f/u any worsening symptoms or concerns, for med refills  

## 2015-10-24 NOTE — Patient Instructions (Signed)
Please continue all other medications as before, and refills have been done if requested.  Please have the pharmacy call with any other refills you may need.  Please keep your appointments with your specialists as you may have planned  Please return in 1 year for your yearly visit, or sooner if needed, with Lab testing done 3-5 days before

## 2015-10-26 ENCOUNTER — Telehealth: Payer: Self-pay | Admitting: Internal Medicine

## 2015-10-26 NOTE — Telephone Encounter (Signed)
[facility] called stating Adderall XR need to have PA done. Please help

## 2015-10-27 NOTE — Telephone Encounter (Signed)
Pt called you back about this. Please give her a call back when you get a chance thanks.

## 2015-10-27 NOTE — Telephone Encounter (Signed)
Dr.John, insurance is requesting a quantity limit review because Adderall is a 24 hr capsule with a sig of BID.  They are requesting medical rationale for exceeded limit or not Rx'ing 20 mg 24hr capsule QD instead? Please advise, thanks

## 2015-10-27 NOTE — Telephone Encounter (Signed)
Ok to contact pt, can we change to adderall xr 20 as insurance is not paying for the 10 twice per day

## 2015-10-27 NOTE — Telephone Encounter (Signed)
LVM for pt to call back as soon as possible.   RE: does insurance cover Adderall 20mg  once daily vs. Adderall 10mg  twice daily.

## 2015-10-28 NOTE — Telephone Encounter (Signed)
Contact pt and she stated that she had called her insurance company and they approved the 10 mg bid. Pt has picked up her prescription. PA has been approved for 1 year.

## 2016-01-22 ENCOUNTER — Telehealth: Payer: Self-pay | Admitting: *Deleted

## 2016-01-22 NOTE — Telephone Encounter (Signed)
Rec'd call pt requesting 3 scripts on her Adderrall...Johny Chess

## 2016-01-23 ENCOUNTER — Other Ambulatory Visit: Payer: Self-pay | Admitting: Internal Medicine

## 2016-01-23 MED ORDER — AMPHETAMINE-DEXTROAMPHET ER 10 MG PO CP24: 10.0000 mg | ORAL_CAPSULE | Freq: Two times a day (BID) | ORAL | 0 refills | Status: DC

## 2016-01-23 NOTE — Telephone Encounter (Signed)
Patient aware prescriptions are ready for pick up

## 2016-01-23 NOTE — Telephone Encounter (Signed)
Done hardcopy to [name]  

## 2016-03-04 ENCOUNTER — Telehealth: Payer: Self-pay | Admitting: *Deleted

## 2016-03-04 NOTE — Telephone Encounter (Signed)
Left msg on triage requesting refill on her alprazolam. MD out of office today will forward to his desktop for approval tomorrow when he returns...Johny Chess

## 2016-03-05 MED ORDER — ALPRAZOLAM 0.5 MG PO TABS: ORAL_TABLET | ORAL | 5 refills | Status: DC

## 2016-03-05 NOTE — Telephone Encounter (Signed)
Done hardcopy to [name]  

## 2016-03-05 NOTE — Telephone Encounter (Signed)
faxed

## 2016-04-17 DIAGNOSIS — Z01419 Encounter for gynecological examination (general) (routine) without abnormal findings: Secondary | ICD-10-CM | POA: Diagnosis not present

## 2016-04-23 ENCOUNTER — Other Ambulatory Visit: Payer: Self-pay | Admitting: Internal Medicine

## 2016-04-23 MED ORDER — AMPHETAMINE-DEXTROAMPHET ER 10 MG PO CP24: 10.0000 mg | ORAL_CAPSULE | Freq: Two times a day (BID) | ORAL | 0 refills | Status: DC

## 2016-04-23 NOTE — Telephone Encounter (Signed)
Patient requesting refill on adderall  °

## 2016-04-23 NOTE — Telephone Encounter (Signed)
Done hardcopy to Shirron  

## 2016-04-23 NOTE — Telephone Encounter (Signed)
Notified pt rx ready for pick-up.../lmb 

## 2016-05-27 ENCOUNTER — Telehealth: Payer: Self-pay | Admitting: *Deleted

## 2016-05-27 NOTE — Telephone Encounter (Signed)
Rec'd call pt requesting refill on her Adderrall...Chelsea Ortiz

## 2016-05-28 MED ORDER — AMPHETAMINE-DEXTROAMPHET ER 10 MG PO CP24: 10.0000 mg | ORAL_CAPSULE | Freq: Two times a day (BID) | ORAL | 0 refills | Status: DC

## 2016-05-28 NOTE — Telephone Encounter (Signed)
Done hardcopy to Shirron - 1 month refill

## 2016-05-28 NOTE — Telephone Encounter (Signed)
Informed pt, script at front desk.

## 2016-07-19 ENCOUNTER — Telehealth: Payer: Self-pay | Admitting: *Deleted

## 2016-07-19 MED ORDER — AMPHETAMINE-DEXTROAMPHET ER 10 MG PO CP24: 10.0000 mg | ORAL_CAPSULE | Freq: Two times a day (BID) | ORAL | 0 refills | Status: DC

## 2016-07-19 NOTE — Telephone Encounter (Signed)
Pt has been informed Script at front desk  

## 2016-07-19 NOTE — Telephone Encounter (Signed)
Done hardcopy to Shirron  

## 2016-07-19 NOTE — Telephone Encounter (Signed)
Rec'd call pt requesting refill on her Adderall...Chelsea Ortiz

## 2016-10-02 ENCOUNTER — Other Ambulatory Visit: Payer: Self-pay | Admitting: Internal Medicine

## 2016-10-03 NOTE — Telephone Encounter (Signed)
Faxed

## 2016-10-03 NOTE — Telephone Encounter (Signed)
Done Done hardcopy to [employer]

## 2016-11-20 ENCOUNTER — Encounter: Payer: Self-pay | Admitting: Internal Medicine

## 2016-11-20 ENCOUNTER — Ambulatory Visit (INDEPENDENT_AMBULATORY_CARE_PROVIDER_SITE_OTHER): Payer: 59 | Admitting: Internal Medicine

## 2016-11-20 ENCOUNTER — Other Ambulatory Visit (INDEPENDENT_AMBULATORY_CARE_PROVIDER_SITE_OTHER): Payer: 59

## 2016-11-20 VITALS — BP 118/80 | HR 70 | Temp 98.8°F | Ht 61.0 in | Wt 161.0 lb

## 2016-11-20 DIAGNOSIS — M79671 Pain in right foot: Secondary | ICD-10-CM | POA: Diagnosis not present

## 2016-11-20 DIAGNOSIS — Z0000 Encounter for general adult medical examination without abnormal findings: Secondary | ICD-10-CM | POA: Diagnosis not present

## 2016-11-20 DIAGNOSIS — R5383 Other fatigue: Secondary | ICD-10-CM

## 2016-11-20 DIAGNOSIS — E669 Obesity, unspecified: Secondary | ICD-10-CM

## 2016-11-20 DIAGNOSIS — Z23 Encounter for immunization: Secondary | ICD-10-CM | POA: Diagnosis not present

## 2016-11-20 DIAGNOSIS — M79672 Pain in left foot: Secondary | ICD-10-CM | POA: Diagnosis not present

## 2016-11-20 DIAGNOSIS — Z114 Encounter for screening for human immunodeficiency virus [HIV]: Secondary | ICD-10-CM | POA: Diagnosis not present

## 2016-11-20 LAB — URINALYSIS, ROUTINE W REFLEX MICROSCOPIC
Bilirubin Urine: NEGATIVE
KETONES UR: NEGATIVE
NITRITE: NEGATIVE
PH: 5.5 (ref 5.0–8.0)
Total Protein, Urine: NEGATIVE
Urine Glucose: NEGATIVE
Urobilinogen, UA: 0.2 (ref 0.0–1.0)

## 2016-11-20 LAB — BASIC METABOLIC PANEL
BUN: 11 mg/dL (ref 6–23)
CHLORIDE: 104 meq/L (ref 96–112)
CO2: 29 mEq/L (ref 19–32)
Calcium: 9.2 mg/dL (ref 8.4–10.5)
Creatinine, Ser: 0.68 mg/dL (ref 0.40–1.20)
GFR: 99.42 mL/min (ref >60.00)
GLUCOSE: 78 mg/dL (ref 70–99)
POTASSIUM: 3.9 meq/L (ref 3.5–5.1)
SODIUM: 138 meq/L (ref 135–145)

## 2016-11-20 LAB — CBC WITH DIFFERENTIAL/PLATELET
BASOS PCT: 0.1 % (ref 0.0–3.0)
Basophils Absolute: 0 10*3/uL (ref 0.0–0.1)
EOS ABS: 0.1 10*3/uL (ref 0.0–0.7)
EOS PCT: 1.1 % (ref 0.0–5.0)
HCT: 39.8 % (ref 36.0–46.0)
HEMOGLOBIN: 13.1 g/dL (ref 12.0–15.0)
LYMPHS ABS: 1.6 10*3/uL (ref 0.7–4.0)
Lymphocytes Relative: 25.3 % (ref 12.0–46.0)
MCHC: 32.9 g/dL (ref 30.0–36.0)
MCV: 94.9 fl (ref 78.0–100.0)
MONO ABS: 0.6 10*3/uL (ref 0.1–1.0)
Monocytes Relative: 9.3 % (ref 3.0–12.0)
NEUTROS PCT: 64.2 % (ref 43.0–77.0)
Neutro Abs: 4.2 10*3/uL (ref 1.4–7.7)
Platelets: 300 10*3/uL (ref 150.0–400.0)
RBC: 4.19 Mil/uL (ref 3.87–5.11)
RDW: 13.2 % (ref 11.5–15.5)
WBC: 6.5 10*3/uL (ref 4.0–10.5)

## 2016-11-20 LAB — LIPID PANEL
CHOLESTEROL: 160 mg/dL (ref 0–200)
HDL: 37.2 mg/dL — ABNORMAL LOW (ref >39.00)
NonHDL: 123.2
Total CHOL/HDL Ratio: 4
Triglycerides: 204 mg/dL — ABNORMAL HIGH (ref 0.0–149.0)
VLDL: 40.8 mg/dL — ABNORMAL HIGH (ref 0.0–40.0)

## 2016-11-20 LAB — HEPATIC FUNCTION PANEL
ALBUMIN: 4.1 g/dL (ref 3.5–5.2)
ALK PHOS: 51 U/L (ref 39–117)
ALT: 17 U/L (ref 0–35)
AST: 15 U/L (ref 0–37)
BILIRUBIN TOTAL: 0.2 mg/dL (ref 0.2–1.2)
Bilirubin, Direct: 0 mg/dL (ref 0.0–0.3)
Total Protein: 6.9 g/dL (ref 6.0–8.3)

## 2016-11-20 LAB — LDL CHOLESTEROL, DIRECT: LDL DIRECT: 97 mg/dL

## 2016-11-20 LAB — TSH: TSH: 3.67 u[IU]/mL (ref 0.35–4.50)

## 2016-11-20 LAB — HIV ANTIBODY (ROUTINE TESTING W REFLEX): HIV: NONREACTIVE

## 2016-11-20 LAB — VITAMIN B12: VITAMIN B 12: 757 pg/mL (ref 211–911)

## 2016-11-20 MED ORDER — PHENTERMINE HCL 37.5 MG PO CAPS: 37.5000 mg | ORAL_CAPSULE | ORAL | 2 refills | Status: DC

## 2016-11-20 MED ORDER — ALPRAZOLAM 0.5 MG PO TABS: 0.5000 mg | ORAL_TABLET | Freq: Two times a day (BID) | ORAL | 5 refills | Status: DC | PRN

## 2016-11-20 NOTE — Assessment & Plan Note (Signed)
Ok for phentermine asd,  to f/u any worsening symptoms or concerns °

## 2016-11-20 NOTE — Assessment & Plan Note (Signed)

## 2016-11-20 NOTE — Progress Notes (Signed)
Subjective:    Patient ID: Chelsea Ortiz, female    DOB: 1970-12-26, 46 y.o.   MRN: [id number]  HPI  Here for wellness and f/u;  Overall doing ok;  Pt denies Chest pain, worsening SOB, DOE, wheezing, orthopnea, PND, worsening LE edema, palpitations, dizziness or syncope.  Pt denies neurological change such as new headache, facial or extremity weakness.  Pt denies polydipsia, polyuria, or low sugar symptoms. Pt states overall good compliance with treatment and medications, good tolerability, and has been trying to follow appropriate diet.  Pt denies worsening depressive symptoms, suicidal ideation or panic. No fever, night sweats, wt loss, loss of appetite, or other constitutional symptoms.  Pt states good ability with ADL's, has low fall risk, home safety reviewed and adequate, no other significant changes in hearing or vision  Due for flu shot.   Wt Readings from Last 3 Encounters:  11/20/16 161 lb (73 kg)  10/24/15 151 lb (68.5 kg)  09/01/15 149 lb (67.6 kg)  Quit smoking x 2.5 mo, has gained significant wt.  Asking for wt loss med, and maybe b12 shot for energy encouraged by her friend.  Also has bad taste salty for unclear reason.  Also has severe electric pain sharp to bilat plantar feet at the insteps only, seems kind of random, maybe only a few days per month, not clear if related to a particular shoe, last 20 mins but then can occur an hour later or 1 mo later. Does have a dog she walks about 1 mile per day  Does not stand much at work or other new exercise program, no shoe change.  No swelling.  No longer taking the adderall as she seems to function well at work and home ok. Past Medical History:  Diagnosis Date  . ADD (attention deficit disorder) 04/22/2011  . Allergic rhinitis, cause unspecified 11/08/2010  . Scoliosis of thoracic spine 04/22/2011   Past Surgical History:  Procedure Laterality Date  . child birth  02/23/2003  . TONSILLECTOMY  1982    reports that she has been smoking  Cigarettes.  She has been smoking about 0.50 packs per day. She has never used smokeless tobacco. She reports that she uses drugs. She reports that she does not drink alcohol. family history includes Heart disease in her other; Hypertension in her other. Allergies  Allergen Reactions  . Penicillins     hives   Current Outpatient Prescriptions on File Prior to Visit  Medication Sig Dispense Refill  . amphetamine-dextroamphetamine (ADDERALL XR) 10 MG 24 hr capsule Take 1 capsule (10 mg total) by mouth 2 (two) times daily. To fill May 28, 2016 60 capsule 0  . triamcinolone ointment (KENALOG) 0.1 % Apply 1 application topically 2 (two) times daily. 30 g 1   No current facility-administered medications on file prior to visit.    Review of Systems Constitutional: Negative for other unusual diaphoresis, sweats, appetite or weight changes HENT: Negative for other worsening hearing loss, ear pain, facial swelling, mouth sores or neck stiffness.   Eyes: Negative for other worsening pain, redness or other visual disturbance.  Respiratory: Negative for other stridor or swelling Cardiovascular: Negative for other palpitations or other chest pain  Gastrointestinal: Negative for worsening diarrhea or loose stools, blood in stool, distention or other pain Genitourinary: Negative for hematuria, flank pain or other change in urine volume.  Musculoskeletal: Negative for myalgias or other joint swelling.  Skin: Negative for other color change, or other wound or worsening drainage.  Neurological: Negative for other syncope or numbness. Hematological: Negative for other adenopathy or swelling Psychiatric/Behavioral: Negative for hallucinations, other worsening agitation, SI, self-injury, or new decreased concentration All other system neg per pt    Objective:   Physical Exam BP 118/80   Pulse 70   Temp 98.8 F (37.1 C) (Oral)   Ht 5\' 1"  (1.549 m)   Wt 161 lb (73 kg)   SpO2 99%   BMI 30.42 kg/m  VS  noted,  Constitutional: Pt is oriented to person, place, and time. Appears well-developed and well-nourished, in no significant distress and comfortable Head: Normocephalic and atraumatic  Eyes: Conjunctivae and EOM are normal. Pupils are equal, round, and reactive to light Right Ear: External ear normal without discharge Left Ear: External ear normal without discharge Nose: Nose without discharge or deformity Mouth/Throat: Oropharynx is without other ulcerations and moist  Neck: Normal range of motion. Neck supple. No JVD present. No tracheal deviation present or significant neck LA or mass Cardiovascular: Normal rate, regular rhythm, normal heart sounds and intact distal pulses.   Pulmonary/Chest: WOB normal and breath sounds without rales or wheezing  Abdominal: Soft. Bowel sounds are normal. NT. No HSM  Musculoskeletal: Normal range of motion. Exhibits no edema Lymphadenopathy: Has no other cervical adenopathy.  Neurological: Pt is alert and oriented to person, place, and time. Pt has normal reflexes. No cranial nerve deficit. Motor grossly intact, Gait intact Skin: Skin is warm and dry. No rash noted or new ulcerations Psychiatric:  Has nervous mood and affect. Behavior is normal without agitation No other exam findings Lab Results  Component Value Date   WBC 6.1 08/25/2014   HGB 14.0 08/25/2014   HCT 41.8 08/25/2014   PLT 287.0 08/25/2014   GLUCOSE 90 08/25/2014   CHOL 162 08/25/2014   TRIG 219.0 (H) 08/25/2014   HDL 32.80 (L) 08/25/2014   LDLDIRECT 108.0 08/25/2014   LDLCALC 91 11/08/2010   ALT 9 08/25/2014   AST 14 08/25/2014   NA 139 08/25/2014   K 4.6 08/25/2014   CL 105 08/25/2014   CREATININE 0.69 08/25/2014   BUN 11 08/25/2014   CO2 28 08/25/2014   TSH 3.61 08/25/2014      Assessment & Plan:

## 2016-11-20 NOTE — Assessment & Plan Note (Signed)
Possibly related to less active and wt gain; for check b12 level as well

## 2016-11-20 NOTE — Patient Instructions (Addendum)
You had the flu shot today  Please take all new medication as prescribed - the phentermine  Please continue all other medications as before, and refills have been done if requested.  Please have the pharmacy call with any other refills you may need.  Please continue your efforts at being more active, low cholesterol diet, and weight control.  You are otherwise up to date with prevention measures today.  Please keep your appointments with your specialists as you may have planned  Please go to the LAB in the Basement (turn left off the elevator) for the tests to be done today  You will be contacted by phone if any changes need to be made immediately.  Otherwise, you will receive a letter about your results with an explanation, but please check with MyChart first.  Please remember to sign up for MyChart if you have not done so, as this will be important to you in the future with finding out test results, communicating by private email, and scheduling acute appointments online when needed.  Please return in 1 year for your yearly visit, or sooner if needed

## 2016-11-20 NOTE — Assessment & Plan Note (Signed)
Exam benign, likely some mild form of plantar fasciitis, for tylenol prn for now, declines sports med or podiatry referral for now

## 2017-02-05 ENCOUNTER — Ambulatory Visit (INDEPENDENT_AMBULATORY_CARE_PROVIDER_SITE_OTHER): Payer: 59 | Admitting: Family Medicine

## 2017-02-05 ENCOUNTER — Encounter: Payer: Self-pay | Admitting: Family Medicine

## 2017-02-05 ENCOUNTER — Ambulatory Visit (INDEPENDENT_AMBULATORY_CARE_PROVIDER_SITE_OTHER)
Admission: RE | Admit: 2017-02-05 | Discharge: 2017-02-05 | Disposition: A | Discharge status: Home / Self Care | Payer: 59 | Source: Ambulatory Visit | Attending: Family Medicine | Admitting: Family Medicine

## 2017-02-05 VITALS — BP 116/82 | HR 78 | Ht 61.0 in | Wt 168.0 lb

## 2017-02-05 DIAGNOSIS — M546 Pain in thoracic spine: Secondary | ICD-10-CM

## 2017-02-05 DIAGNOSIS — M4134 Thoracogenic scoliosis, thoracic region: Secondary | ICD-10-CM

## 2017-02-05 DIAGNOSIS — M4184 Other forms of scoliosis, thoracic region: Secondary | ICD-10-CM | POA: Diagnosis not present

## 2017-02-05 DIAGNOSIS — M545 Low back pain: Secondary | ICD-10-CM | POA: Diagnosis not present

## 2017-02-05 MED ORDER — PREDNISONE 50 MG PO TABS: 50.0000 mg | ORAL_TABLET | Freq: Every day | ORAL | 0 refills | Status: DC

## 2017-02-05 NOTE — Progress Notes (Signed)
[facility] [place], [place] [address] Phone: [phone] Subjective:    I'm seeing this patient by the request  of:  Biagio Borg, MD   CC: Back pain  BHA:[id number]  Chelsea Ortiz is a 47 y.o. female coming in with complaint of thoracic back pain. Patient states she has scoliosis.   Onset- Chronic  Location- Left thoracic back Character- Dull, achy  Aggravating factors- Laying down, lifting  Reliving factors-stopping activity. Therapies tried-nothing Severity-6 out of 10 possibly worsening over the course of time     Past Medical History:  Diagnosis Date  . ADD (attention deficit disorder) 04/22/2011  . Allergic rhinitis, cause unspecified 11/08/2010  . Scoliosis of thoracic spine 04/22/2011   Past Surgical History:  Procedure Laterality Date  . child birth  02/23/2003  . TONSILLECTOMY  1982   Social History   Socioeconomic History  . Marital status: Married    Spouse name: Not on file  . Number of children: Not on file  . Years of education: 41  . Highest education level: Not on file  Social Needs  . Financial resource strain: Not on file  . Food insecurity - worry: Not on file  . Food insecurity - inability: Not on file  . Transportation needs - medical: Not on file  . Transportation needs - non-medical: Not on file  Occupational History  . Occupation: rental cord.  Tobacco Use  . Smoking status: Current Every Day Smoker    Packs/day: 0.50    Types: Cigarettes  . Smokeless tobacco: Never Used  Substance and Sexual Activity  . Alcohol use: No  . Drug use: Yes  . Sexual activity: Not on file  Other Topics Concern  . Not on file  Social History Narrative  . Not on file   Allergies  Allergen Reactions  . Penicillins     hives   Family History  Problem Relation Age of Onset  . Heart disease Other   . Hypertension Other      Past medical history, social, surgical and family history all reviewed in  electronic medical record.  No pertanent information unless stated regarding to the chief complaint.   Review of Systems:Review of systems updated and as accurate as of 02/05/17  No headache, visual changes, nausea, vomiting, diarrhea, constipation, dizziness, abdominal pain, skin rash, fevers, chills, night sweats, weight loss, swollen lymph nodes, body aches, joint swelling, chest pain, shortness of breath, mood changes.  Positive muscle aches Objective  There were no vitals taken for this visit. Systems examined below as of 02/05/17   General: No apparent distress alert and oriented x3 mood and affect normal, dressed appropriately.  HEENT: Pupils equal, extraocular movements intact  Respiratory: Patient's speak in full sentences and does not appear short of breath  Cardiovascular: No lower extremity edema, non tender, no erythema  Skin: Warm dry intact with no signs of infection or rash on extremities or on axial skeleton.  Abdomen: Soft nontender  Neuro: Cranial nerves II through XII are intact, neurovascularly intact in all extremities with 2+ DTRs and 2+ pulses.  Lymph: No lymphadenopathy of posterior or anterior cervical chain or axillae bilaterally.  Gait normal with good balance and coordination.  MSK:  Non tender with full range of motion and good stability and symmetric strength and tone of shoulders, elbows, wrist, hip, knee and ankles bilaterally.  Back Exam:  Inspection: Significant scoliosis with levoscoliosis of the upper thoracic spine and a  rigid scoliosis of the lumbar spine Motion: Flexion 35 deg, Extension 25 deg, Side Bending to 45 deg bilaterally,  Rotation to 45 deg bilaterally  SLR laying: Negative  XSLR laying: Negative  Palpable tenderness: Tender to palpation more in the paraspinal musculature around the scapula.Marland Kitchen FABER: Positive tightness on the right. Sensory change: Gross sensation intact to all lumbar and sacral dermatomes.  Reflexes: 2+ at both patellar  tendons, 2+ at achilles tendons, Babinski's downgoing.  Strength at foot  Plantar-flexion: 5/5 Dorsi-flexion: 5/5 Eversion: 5/5 Inversion: 5/5  Leg strength  Quad: 5/5 Hamstring: 5/5 Hip flexor: 5/5 Hip abductors: 5/5  Gait unremarkable.  Osteopathic findings C2 flexed rotated and side bent right T2 through T7 rotated left side bent right in a neutral position T9 extended rotated and side bent left L2-L5 rotated right side bent left in a neutral position Sacrum right on right  [id number]; 15 additional minutes spent for Therapeutic exercises as stated in above notes.  This included exercises focusing on stretching, strengthening, with significant focus on eccentric aspects.   Long term goals include an improvement in range of motion, strength, endurance as well as avoiding reinjury. Patient's frequency would include in 1-2 times a day, 3-5 times a week for a duration of 6-12 weeks. Exercises that included:  Basic scapular stabilization to include adduction and depression of scapula  Scaption, focusing on proper movement and good control Rows with theraband which was given    Proper technique shown and discussed handout in great detail with ATC.  All questions were discussed and answered.     Impression and Recommendations:     This case required medical decision making of moderate complexity.      Note: This dictation was prepared with Dragon dictation along with smaller phrase technology. Any transcriptional errors that result from this process are unintentional.

## 2017-02-05 NOTE — Patient Instructions (Addendum)
Good to see you  Ice 20 minutes 2 times daily. Usually after activity and before bed. Exercises 3 times a week.  Tennis ball between shoulder blades with driving.  xrays downstairs Prednisone daily for 5 days Then start  Vitamin D 2000 IU daily  Turmeric 500mg  twice daily  Tart cherry extract any dose at night See me again in 3 weeks.

## 2017-02-05 NOTE — Assessment & Plan Note (Signed)
Significant scoliosis.  X-rays ordered today to further evaluate.  Patient prednisone given today secondary to the increase in tenderness to palpation.  We discussed icing regimen, home exercises and patient work with Product/process development scientist.  We discussed which activities of doing which wants to avoid.  We discussed over-the-counter medications as well.  Follow-up again in 4 weeks

## 2017-02-07 ENCOUNTER — Telehealth: Payer: Self-pay | Admitting: Internal Medicine

## 2017-02-07 NOTE — Telephone Encounter (Signed)
Copied from [place] [id number]. Topic: Quick Communication - See Telephone Encounter >> Feb 07, 2017  2:49 PM Conception Chancy, NT wrote: CRM for notification. See Telephone encounter for:  02/07/17.  Patient states she would like doctor John nurse to call her. She has a few medications that she says she has questions on that Dr. Jenny Reichmann is wanting her to take. Please advise and contact patient back  Tart Cherry Extract Turmeric

## 2017-02-07 NOTE — Telephone Encounter (Addendum)
Patient would like to know where she could find that [employer] that you told her about?  Also, does it matter what type of tumeric she should get? Brand?  She stated if she doesn't answer that it is ok to leave a detailed VM.

## 2017-02-07 NOTE — Telephone Encounter (Signed)
Called pt, LVM.   

## 2017-02-07 NOTE — Telephone Encounter (Signed)
Please call patient back [phone]

## 2017-02-07 NOTE — Telephone Encounter (Signed)
Ok to ask if she has any specific questions

## 2017-02-08 NOTE — Telephone Encounter (Signed)
Look at [employer] or whole foods or [employer].  Natures made is usually good but don't worry too much on brand for now.

## 2017-02-10 NOTE — Telephone Encounter (Signed)
Called pt, left detailed msg with instructions below per Dr. Tamala Julian.

## 2017-02-25 ENCOUNTER — Encounter: Payer: Self-pay | Admitting: Family Medicine

## 2017-02-25 ENCOUNTER — Ambulatory Visit (INDEPENDENT_AMBULATORY_CARE_PROVIDER_SITE_OTHER): Payer: 59 | Admitting: Family Medicine

## 2017-02-25 VITALS — BP 110/82 | HR 76 | Ht 61.0 in | Wt 169.0 lb

## 2017-02-25 DIAGNOSIS — M4134 Thoracogenic scoliosis, thoracic region: Secondary | ICD-10-CM | POA: Diagnosis not present

## 2017-02-25 DIAGNOSIS — M999 Biomechanical lesion, unspecified: Secondary | ICD-10-CM | POA: Insufficient documentation

## 2017-02-25 NOTE — Progress Notes (Signed)
[facility] [place], [place] [address] Phone: [phone] Subjective:    I'm seeing this patient by the request  of:    CC: Back pain follow-up  ELF:[id number]  Chelsea Ortiz is a 47 y.o. female coming in for follow up for thoracic spine pain. She said that her pain has improved and she does not note any times during the day that she has pain.  Patient feels that the prednisone was significantly helpful.  Not having a severe amount of pain.  Still has significant scapular region though. Discussed posture and ergonomics patient states that the exercises have been helpful as well.     Past Medical History:  Diagnosis Date  . ADD (attention deficit disorder) 04/22/2011  . Allergic rhinitis, cause unspecified 11/08/2010  . Scoliosis of thoracic spine 04/22/2011   Past Surgical History:  Procedure Laterality Date  . child birth  02/23/2003  . TONSILLECTOMY  1982   Social History   Socioeconomic History  . Marital status: Married    Spouse name: None  . Number of children: None  . Years of education: 74  . Highest education level: None  Social Needs  . Financial resource strain: None  . Food insecurity - worry: None  . Food insecurity - inability: None  . Transportation needs - medical: None  . Transportation needs - non-medical: None  Occupational History  . Occupation: rental cord.  Tobacco Use  . Smoking status: Current Every Day Smoker    Packs/day: 0.50    Types: Cigarettes  . Smokeless tobacco: Never Used  Substance and Sexual Activity  . Alcohol use: No  . Drug use: Yes  . Sexual activity: None  Other Topics Concern  . None  Social History Narrative  . None   Allergies  Allergen Reactions  . Penicillins     hives   Family History  Problem Relation Age of Onset  . Heart disease Other   . Hypertension Other      Past medical history, social, surgical and family history all reviewed in electronic medical  record.  No pertanent information unless stated regarding to the chief complaint.   Review of Systems:Review of systems updated and as accurate as of 02/25/17  No headache, visual changes, nausea, vomiting, diarrhea, constipation, dizziness, abdominal pain, skin rash, fevers, chills, night sweats, weight loss, swollen lymph nodes, body aches, joint swelling, chest pain, shortness of breath, mood changes.  Positive muscle aches Objective  Blood pressure 110/82, pulse 76, height 5\' 1"  (1.549 m), weight 169 lb (76.7 kg), SpO2 98 %. Systems examined below as of 02/25/17   General: No apparent distress alert and oriented x3 mood and affect normal, dressed appropriately.  HEENT: Pupils equal, extraocular movements intact  Respiratory: Patient's speak in full sentences and does not appear short of breath  Cardiovascular: No lower extremity edema, non tender, no erythema  Skin: Warm dry intact with no signs of infection or rash on extremities or on axial skeleton.  Abdomen: Soft nontender  Neuro: Cranial nerves II through XII are intact, neurovascularly intact in all extremities with 2+ DTRs and 2+ pulses.  Lymph: No lymphadenopathy of posterior or anterior cervical chain or axillae bilaterally.  Gait normal with good balance and coordination.  MSK:  Non tender with full range of motion and good stability and symmetric strength and tone of shoulders, elbows, wrist, hip, knee and ankles bilaterally.  Back Exam:  Inspection: Significant scoliosis Motion: Flexion 35 deg,  Extension 25 deg, Side Bending to 35 deg bilaterally,  Rotation to 35 deg bilaterally  SLR laying: Negative  XSLR laying: Negative  Palpable tenderness: Tender to palpation the paraspinal musculature.  Mostly of the left scapular region FABER: negative. Sensory change: Gross sensation intact to all lumbar and sacral dermatomes.  Reflexes: 2+ at both patellar tendons, 2+ at achilles tendons, Babinski's downgoing.  Strength at foot    Plantar-flexion: 5/5 Dorsi-flexion: 5/5 Eversion: 5/5 Inversion: 5/5  Leg strength  Quad: 5/5 Hamstring: 5/5 Hip flexor: 5/5 Hip abductors: 5/5  Gait unremarkable.  Osteopathic findings C2 flexed rotated and side bent right T2 through T7 rotated left side bent right in a neutral position T9 extended rotated and side bent left L2-L5 rotated right side bent left in a neutral position Sacrum right on right    Impression and Recommendations:     This case required medical decision making of moderate complexity.      Note: This dictation was prepared with Dragon dictation along with smaller phrase technology. Any transcriptional errors that result from this process are unintentional.

## 2017-02-25 NOTE — Assessment & Plan Note (Signed)
Decision today to treat with OMT was based on Physical Exam  After verbal consent patient was treated with HVLA, ME, FPR techniques in cervical, thoracic, rib lumbar and sacral areas  Patient tolerated the procedure well with improvement in symptoms  Patient given exercises, stretches and lifestyle modifications  See medications in patient instructions if given  Patient will follow up in 4-8 weeks 

## 2017-02-25 NOTE — Patient Instructions (Signed)
Good to see you  Overall doing well  Still a lot of tightness but much better Don't know if manipulation will help but we will see  COnitnue the exercises COnitnue the vitamins and may stop the tart cherry  See me again in 6 weeks

## 2017-02-25 NOTE — Assessment & Plan Note (Signed)
Patient still has a significant scoliosis.  Attempted osteopathic manipulation with very minimal benefit again.  Patient did have significant tightness.  We will see how patient responds well.  We discussed with patient.  Follow-up again in 4-8 weeks

## 2017-03-04 DIAGNOSIS — R6889 Other general symptoms and signs: Secondary | ICD-10-CM | POA: Diagnosis not present

## 2017-03-04 DIAGNOSIS — J40 Bronchitis, not specified as acute or chronic: Secondary | ICD-10-CM | POA: Diagnosis not present

## 2017-03-09 ENCOUNTER — Encounter: Payer: Self-pay | Admitting: Emergency Medicine

## 2017-03-09 ENCOUNTER — Emergency Department
Admission: EM | Admit: 2017-03-09 | Discharge: 2017-03-09 | Disposition: A | Discharge status: Home / Self Care | Payer: 59 | Attending: Emergency Medicine | Admitting: Emergency Medicine

## 2017-03-09 ENCOUNTER — Other Ambulatory Visit: Payer: Self-pay

## 2017-03-09 DIAGNOSIS — H5711 Ocular pain, right eye: Secondary | ICD-10-CM | POA: Diagnosis present

## 2017-03-09 DIAGNOSIS — Y939 Activity, unspecified: Secondary | ICD-10-CM | POA: Diagnosis not present

## 2017-03-09 DIAGNOSIS — S0501XA Injury of conjunctiva and corneal abrasion without foreign body, right eye, initial encounter: Secondary | ICD-10-CM | POA: Diagnosis not present

## 2017-03-09 DIAGNOSIS — Y999 Unspecified external cause status: Secondary | ICD-10-CM | POA: Diagnosis not present

## 2017-03-09 DIAGNOSIS — B3731 Acute candidiasis of vulva and vagina: Secondary | ICD-10-CM

## 2017-03-09 DIAGNOSIS — X58XXXA Exposure to other specified factors, initial encounter: Secondary | ICD-10-CM | POA: Diagnosis not present

## 2017-03-09 DIAGNOSIS — Y929 Unspecified place or not applicable: Secondary | ICD-10-CM | POA: Diagnosis not present

## 2017-03-09 DIAGNOSIS — B373 Candidiasis of vulva and vagina: Secondary | ICD-10-CM | POA: Diagnosis not present

## 2017-03-09 DIAGNOSIS — F17210 Nicotine dependence, cigarettes, uncomplicated: Secondary | ICD-10-CM | POA: Diagnosis not present

## 2017-03-09 DIAGNOSIS — Z79899 Other long term (current) drug therapy: Secondary | ICD-10-CM | POA: Diagnosis not present

## 2017-03-09 MED ORDER — FLUORESCEIN SODIUM 1 MG OP STRP
1.0000 | ORAL_STRIP | Freq: Once | OPHTHALMIC | Status: AC
  Administered 2017-03-09: 1 via OPHTHALMIC
  Filled 2017-03-09: qty 1

## 2017-03-09 MED ORDER — FLUCONAZOLE 150 MG PO TABS: 150.0000 mg | ORAL_TABLET | Freq: Every day | ORAL | 0 refills | Status: DC

## 2017-03-09 MED ORDER — TETRACAINE HCL 0.5 % OP SOLN
2.0000 [drp] | Freq: Once | OPHTHALMIC | Status: AC
  Administered 2017-03-09: 2 [drp] via OPHTHALMIC
  Filled 2017-03-09: qty 4

## 2017-03-09 MED ORDER — KETOROLAC TROMETHAMINE 0.5 % OP SOLN: 1.0000 [drp] | Freq: Four times a day (QID) | OPHTHALMIC | 0 refills | Status: DC

## 2017-03-09 MED ORDER — ERYTHROMYCIN 5 MG/GM OP OINT: TOPICAL_OINTMENT | Freq: Three times a day (TID) | OPHTHALMIC | 0 refills | Status: AC

## 2017-03-09 NOTE — ED Notes (Signed)
See triage note  Presents with pain and redness to right eye   Unsure of injury

## 2017-03-09 NOTE — ED Provider Notes (Signed)
[facility] Emergency Department Provider Note ____________________________________________  Time seen: Approximately 5:37 PM  I have reviewed the triage vital signs and the nursing notes.   HISTORY  Chief Complaint Eye Problem   HPI Chelsea Ortiz is a 47 y.o. female who presents to the emergency department for evaluation treatment of pain and redness to the right.  No specific point of injury.  She does wear contact lenses but is not wearing them at this time.  She has had previous corneal abrasion and feels that this is the same pain.  Pain started yesterday, but worsened overnight.  Pain is worsened by light.  No alleviating measures have been attempted for this complaint prior to arrival.  Past Medical History:  Diagnosis Date  . ADD (attention deficit disorder) 04/22/2011  . Allergic rhinitis, cause unspecified 11/08/2010  . Scoliosis of thoracic spine 04/22/2011    Patient Active Problem List   Diagnosis Date Noted  . Nonallopathic lesion of thoracic region 02/25/2017  . Nonallopathic lesion of sacral region 02/25/2017  . Bilateral foot pain 11/20/2016  . Fatigue 11/20/2016  . Rash and nonspecific skin eruption 09/01/2015  . Umbilical abnormality 64/33/2951  . Obesity 08/25/2014  . Goiter 08/25/2014  . ADD (attention deficit disorder) 04/22/2011  . Anxiety 04/22/2011  . Scoliosis of thoracic spine 04/22/2011  . Allergic rhinitis, cause unspecified 11/08/2010  . Preventative health care 11/05/2010    Past Surgical History:  Procedure Laterality Date  . child birth  02/23/2003  . TONSILLECTOMY  1982    Prior to Admission medications   Medication Sig Start Date End Date Taking? Authorizing Provider  ALPRAZolam Duanne Moron) 0.5 MG tablet Take 1 tablet (0.5 mg total) by mouth 2 (two) times daily as needed. for anxiety 11/20/16   Biagio Borg, MD  erythromycin [facility]) ophthalmic ointment Place into the right eye 3 (three) times daily for 10 days.  Place a 1/2 inch ribbon of ointment into the lower eyelid. 03/09/17 03/19/17  Paiden Caraveo, Johnette Abraham B, FNP  fluconazole (DIFLUCAN) 150 MG tablet Take 1 tablet (150 mg total) by mouth daily. 03/09/17   Mikayla Chiusano, Johnette Abraham B, FNP  ketorolac (ACULAR) 0.5 % ophthalmic solution Place 1 drop into the right eye 4 (four) times daily. 03/09/17   Nguyen Todorov, Johnette Abraham B, FNP  phentermine 37.5 MG capsule Take 1 capsule (37.5 mg total) by mouth every morning. Patient not taking: Reported on 02/25/2017 11/20/16   Biagio Borg, MD    Allergies Penicillins  Family History  Problem Relation Age of Onset  . Heart disease Other   . Hypertension Other     Social History Social History   Tobacco Use  . Smoking status: Current Every Day Smoker    Packs/day: 0.50    Types: Cigarettes  . Smokeless tobacco: Never Used  Substance Use Topics  . Alcohol use: No  . Drug use: Yes    Review of Systems   Constitutional: No fever/chills Eyes: Negative for visual changes.  Positive for pain. Musculoskeletal: Negative for pain. Skin: Negative for rash. Neurological: Negative for headaches, focal weakness or numbness. Allergic: Negative for seasonal allergies. ____________________________________________  PHYSICAL EXAM:  VITAL SIGNS: ED Triage Vitals  Enc Vitals Group     BP 03/09/17 1020 125/74     Pulse Rate 03/09/17 1020 67     Resp 03/09/17 1020 15     Temp 03/09/17 1020 98.4 F (36.9 C)     Temp Source 03/09/17 1020 Oral     SpO2 03/09/17  1020 97 %     Weight 03/09/17 1014 169 lb (76.7 kg)     Height 03/09/17 1014 5\' 1"  (1.549 m)     Head Circumference --      Peak Flow --      Pain Score 03/09/17 1014 10     Pain Loc --      Pain Edu? --      Excl. in [place]? --     Constitutional: Alert and oriented. Well appearing and in no acute distress. Eyes: Visual acuity--see nursing documentation; no globe trauma; Eyelids normal to inspection; Sclera appears anicteric.  Eyelids not inverted. Conjunctiva appears  erythematous; Cornea : Pinpoint abrasion noted at approximately 7 o'clock position 1 mm away from the limbus on the side of the iris.  Head: Atraumatic. Nose: No congestion/rhinnorhea. Mouth/Throat: Mucous membranes are moist.  Oropharynx non-erythematous. Respiratory: Respirations even and unlabored. Musculoskeletal:Normal ROM x 4 extremities. Neurologic:  Normal speech and language. No gross focal neurologic deficits are appreciated. Speech is normal. No gait instability. Skin:  Skin is warm, dry and intact. No rash noted. Psychiatric: Mood and affect are normal. Speech and behavior are normal.  ____________________________________________   LABS (all labs ordered are listed, but only abnormal results are displayed)  Labs Reviewed - No data to display ____________________________________________  EKG  Not indicated ____________________________________________  RADIOLOGY  Not indicated ____________________________________________   PROCEDURES  Procedure(s) performed: None ____________________________________________   INITIAL IMPRESSION / ASSESSMENT AND PLAN / ED COURSE  47 year old female presenting to the emergency department for symptoms and evaluation most consistent with punctate corneal abrasion.  She will be given a prescription for erythromycin ophthalmic ointment and encouraged to see ophthalmology if not improving over the next 2 days.  She was also given a prescription for Acular eyedrops.  She is to return to the emergency department for symptoms that change or worsen if she is unable to schedule an appointment with ophthalmology.  Also, the patient states that she was treated with doxycycline while having bronchitis and now has a vaginal yeast infection.  She is requesting a prescription for Diflucan.  This seems reasonable and she will be given the prescription and advised to follow-up with her primary care provider if she does not feel that she is improving over  the next few days.  Pertinent labs & imaging results that were available during my care of the patient were reviewed by me and considered in my medical decision making (see chart for details). ____________________________________________   FINAL CLINICAL IMPRESSION(S) / ED DIAGNOSES  Final diagnoses:  Abrasion of right cornea, initial encounter  Vaginal candida    Note:  This document was prepared using Dragon voice recognition software and may include unintentional dictation errors.    Victorino Dike, FNP 03/09/17 1742    Lavonia Drafts, MD 03/09/17 2021

## 2017-03-09 NOTE — ED Notes (Signed)
Both eyes 20/25, rt 20/25, lt 20/40

## 2017-03-09 NOTE — ED Triage Notes (Addendum)
Here for pain to right eye that started yesterday and got worse over night.   Reports cannot open because of pain.  When RN opens eye, sclera red.  Pain feels like to top of eye.  Does wear contacts, they are out now.  Pt describing pain similar to corneal abrasion but does not remember injury

## 2017-04-08 ENCOUNTER — Ambulatory Visit: Payer: 59 | Admitting: Family Medicine

## 2017-06-24 ENCOUNTER — Telehealth: Payer: Self-pay | Admitting: Internal Medicine

## 2017-06-24 MED ORDER — PHENTERMINE HCL 37.5 MG PO CAPS: 37.5000 mg | ORAL_CAPSULE | ORAL | 1 refills | Status: DC

## 2017-06-24 NOTE — Telephone Encounter (Signed)
Copied from [place] [id number] [id number]. Topic: Inquiry >> Jun 24, 2017  8:09 AM Chelsea Ortiz wrote: Reason for CRM: Patient called stating that she needs a new refill of Phentermine 37.5 MG capsule sent to her pharmacy. Patient states that she had a third refill, but it has expired. Patient would like Dr. Jenny Reichmann or his assistant to call her back and let her know if they would send a new prescription to her pharmacy. Patient's preferred pharmacy is [id number] - [place], [place] [phone] (Phone) [phone] (Fax).

## 2017-06-24 NOTE — Telephone Encounter (Signed)
Done erx 

## 2017-06-24 NOTE — Addendum Note (Signed)
Addended by: Biagio Borg on: 06/24/2017 11:07 AM   Modules accepted: Orders

## 2017-07-07 ENCOUNTER — Other Ambulatory Visit: Payer: Self-pay | Admitting: Internal Medicine

## 2017-07-07 NOTE — Telephone Encounter (Signed)
Done erx 

## 2017-07-07 NOTE — Telephone Encounter (Signed)
05/12/2017 60#   

## 2017-07-23 DIAGNOSIS — R600 Localized edema: Secondary | ICD-10-CM | POA: Diagnosis not present

## 2017-07-23 DIAGNOSIS — L255 Unspecified contact dermatitis due to plants, except food: Secondary | ICD-10-CM | POA: Diagnosis not present

## 2017-07-31 DIAGNOSIS — H5213 Myopia, bilateral: Secondary | ICD-10-CM | POA: Diagnosis not present

## 2017-09-01 ENCOUNTER — Other Ambulatory Visit (INDEPENDENT_AMBULATORY_CARE_PROVIDER_SITE_OTHER): Payer: 59

## 2017-09-01 ENCOUNTER — Ambulatory Visit: Payer: 59 | Admitting: Internal Medicine

## 2017-09-01 ENCOUNTER — Encounter: Payer: Self-pay | Admitting: Internal Medicine

## 2017-09-01 VITALS — BP 120/80 | HR 81 | Temp 99.0°F | Ht 61.0 in | Wt 169.0 lb

## 2017-09-01 DIAGNOSIS — Z0000 Encounter for general adult medical examination without abnormal findings: Secondary | ICD-10-CM

## 2017-09-01 DIAGNOSIS — R635 Abnormal weight gain: Secondary | ICD-10-CM | POA: Diagnosis not present

## 2017-09-01 LAB — HEPATIC FUNCTION PANEL
ALK PHOS: 49 U/L (ref 39–117)
ALT: 40 U/L — AB (ref 0–35)
AST: 22 U/L (ref 0–37)
Albumin: 4.3 g/dL (ref 3.5–5.2)
BILIRUBIN DIRECT: 0 mg/dL (ref 0.0–0.3)
TOTAL PROTEIN: 7.2 g/dL (ref 6.0–8.3)
Total Bilirubin: 0.3 mg/dL (ref 0.2–1.2)

## 2017-09-01 LAB — LIPID PANEL
CHOL/HDL RATIO: 5
CHOLESTEROL: 179 mg/dL (ref 0–200)
HDL: 38.6 mg/dL — ABNORMAL LOW (ref >39.00)
NonHDL: 140.47
Triglycerides: 388 mg/dL — ABNORMAL HIGH (ref 0.0–149.0)
VLDL: 77.6 mg/dL — ABNORMAL HIGH (ref 0.0–40.0)

## 2017-09-01 LAB — CBC WITH DIFFERENTIAL/PLATELET
Basophils Absolute: 0.1 10*3/uL (ref 0.0–0.1)
Basophils Relative: 1.3 % (ref 0.0–3.0)
EOS ABS: 0.1 10*3/uL (ref 0.0–0.7)
EOS PCT: 1.4 % (ref 0.0–5.0)
HEMATOCRIT: 39.3 % (ref 36.0–46.0)
HEMOGLOBIN: 13.5 g/dL (ref 12.0–15.0)
LYMPHS PCT: 31 % (ref 12.0–46.0)
Lymphs Abs: 2.1 10*3/uL (ref 0.7–4.0)
MCHC: 34.3 g/dL (ref 30.0–36.0)
MCV: 93.1 fl (ref 78.0–100.0)
Monocytes Absolute: 0.6 10*3/uL (ref 0.1–1.0)
Monocytes Relative: 8.4 % (ref 3.0–12.0)
Neutro Abs: 4 10*3/uL (ref 1.4–7.7)
Neutrophils Relative %: 57.9 % (ref 43.0–77.0)
Platelets: 285 10*3/uL (ref 150.0–400.0)
RBC: 4.22 Mil/uL (ref 3.87–5.11)
RDW: 13.7 % (ref 11.5–15.5)
WBC: 6.9 10*3/uL (ref 4.0–10.5)

## 2017-09-01 LAB — URINALYSIS, ROUTINE W REFLEX MICROSCOPIC
Bilirubin Urine: NEGATIVE
KETONES UR: NEGATIVE
NITRITE: NEGATIVE
PH: 5.5 (ref 5.0–8.0)
RBC / HPF: NONE SEEN (ref 0–?)
Total Protein, Urine: NEGATIVE
URINE GLUCOSE: NEGATIVE
Urobilinogen, UA: 0.2 (ref 0.0–1.0)

## 2017-09-01 LAB — TSH: TSH: 3.54 u[IU]/mL (ref 0.35–4.50)

## 2017-09-01 LAB — BASIC METABOLIC PANEL
BUN: 11 mg/dL (ref 6–23)
CALCIUM: 9.7 mg/dL (ref 8.4–10.5)
CO2: 28 mEq/L (ref 19–32)
CREATININE: 0.74 mg/dL (ref 0.40–1.20)
Chloride: 103 mEq/L (ref 96–112)
GFR: 89.86 mL/min (ref >60.00)
Glucose, Bld: 104 mg/dL — ABNORMAL HIGH (ref 70–99)
POTASSIUM: 4.5 meq/L (ref 3.5–5.1)
Sodium: 139 mEq/L (ref 135–145)

## 2017-09-01 LAB — CORTISOL: Cortisol, Plasma: 2.8 ug/dL

## 2017-09-01 LAB — T4, FREE: FREE T4: 0.56 ng/dL — AB (ref 0.60–1.60)

## 2017-09-01 LAB — LDL CHOLESTEROL, DIRECT: LDL DIRECT: 104 mg/dL

## 2017-09-01 MED ORDER — PHENTERMINE HCL 37.5 MG PO CAPS: 37.5000 mg | ORAL_CAPSULE | ORAL | 2 refills | Status: DC

## 2017-09-01 NOTE — Progress Notes (Signed)
Subjective:    Patient ID: Chelsea Ortiz, female    DOB: 08/06/1970, 47 y.o.   MRN: [id number]  HPI  Here for wellness and f/u;  Overall doing ok;  Pt denies Chest pain, worsening SOB, DOE, wheezing, orthopnea, PND, worsening LE edema, palpitations, dizziness or syncope.  Pt denies neurological change such as new headache, facial or extremity weakness.  Pt denies polydipsia, polyuria, or low sugar symptoms. Pt states overall good compliance with treatment and medications, good tolerability, and has been trying to follow appropriate diet.  Pt denies worsening depressive symptoms, suicidal ideation or panic. No fever, night sweats, wt loss, loss of appetite, or other constitutional symptoms.  Pt states good ability with ADL's, has low fall risk, home safety reviewed and adequate, no other significant changes in hearing or vision, and only occasionally active with exercise.  Asks for thyroid check, eating quite bit, gained wt, lots of stress, Denies worsening depressive symptoms, suicidal ideation, or panic No new complaints except for worswening wt gain and asks for phentermine  Past Medical History:  Diagnosis Date  . ADD (attention deficit disorder) 04/22/2011  . Allergic rhinitis, cause unspecified 11/08/2010  . Scoliosis of thoracic spine 04/22/2011   Past Surgical History:  Procedure Laterality Date  . child birth  02/23/2003  . TONSILLECTOMY  1982    reports that she has been smoking cigarettes.  She has been smoking about 0.50 packs per day. She has never used smokeless tobacco. She reports that she has current or past drug history. She reports that she does not drink alcohol. family history includes Heart disease in her other; Hypertension in her other. Allergies  Allergen Reactions  . Penicillins     hives   Current Outpatient Medications on File Prior to Visit  Medication Sig Dispense Refill  . ALPRAZolam (XANAX) 0.5 MG tablet TAKE 1 TABLET BY MOUTH TWICE A DAY AS NEEDED 60 tablet 5    . fluconazole (DIFLUCAN) 150 MG tablet Take 1 tablet (150 mg total) by mouth daily. 1 tablet 0  . ketorolac (ACULAR) 0.5 % ophthalmic solution Place 1 drop into the right eye 4 (four) times daily. 5 mL 0   No current facility-administered medications on file prior to visit.     Review of Systems Constitutional: Negative for other unusual diaphoresis, sweats, appetite or weight changes HENT: Negative for other worsening hearing loss, ear pain, facial swelling, mouth sores or neck stiffness.   Eyes: Negative for other worsening pain, redness or other visual disturbance.  Respiratory: Negative for other stridor or swelling Cardiovascular: Negative for other palpitations or other chest pain  Gastrointestinal: Negative for worsening diarrhea or loose stools, blood in stool, distention or other pain Genitourinary: Negative for hematuria, flank pain or other change in urine volume.  Musculoskeletal: Negative for myalgias or other joint swelling.  Skin: Negative for other color change, or other wound or worsening drainage.  Neurological: Negative for other syncope or numbness. Hematological: Negative for other adenopathy or swelling Psychiatric/Behavioral: Negative for hallucinations, other worsening agitation, SI, self-injury, or new decreased concentration All other system neg per pt    Objective:   Physical Exam BP 120/80   Pulse 81   Temp 99 F (37.2 C) (Oral)   Ht 5\' 1"  (1.549 m)   Wt 169 lb (76.7 kg)   SpO2 97%   BMI 31.93 kg/m  VS noted,  Constitutional: Pt is oriented to person, place, and time. Appears well-developed and well-nourished, in no significant distress and comfortable  Head: Normocephalic and atraumatic  Eyes: Conjunctivae and EOM are normal. Pupils are equal, round, and reactive to light Right Ear: External ear normal without discharge Left Ear: External ear normal without discharge Nose: Nose without discharge or deformity Mouth/Throat: Oropharynx is without  other ulcerations and moist  Neck: Normal range of motion. Neck supple. No JVD present. No tracheal deviation present or significant neck LA or mass Cardiovascular: Normal rate, regular rhythm, normal heart sounds and intact distal pulses.   Pulmonary/Chest: WOB normal and breath sounds without rales or wheezing  Abdominal: Soft. Bowel sounds are normal. NT. No HSM  Musculoskeletal: Normal range of motion. Exhibits no edema Lymphadenopathy: Has no other cervical adenopathy.  Neurological: Pt is alert and oriented to person, place, and time. Pt has normal reflexes. No cranial nerve deficit. Motor grossly intact, Gait intact Skin: Skin is warm and dry. No rash noted or new ulcerations Psychiatric:  Has normal mood and affect. Behavior is normal without agitation No other exam findings Lab Results  Component Value Date   WBC 6.9 09/01/2017   HGB 13.5 09/01/2017   HCT 39.3 09/01/2017   PLT 285.0 09/01/2017   GLUCOSE 104 (H) 09/01/2017   CHOL 179 09/01/2017   TRIG 388.0 (H) 09/01/2017   HDL 38.60 (L) 09/01/2017   LDLDIRECT 104.0 09/01/2017   LDLCALC 91 11/08/2010   ALT 40 (H) 09/01/2017   AST 22 09/01/2017   NA 139 09/01/2017   K 4.5 09/01/2017   CL 103 09/01/2017   CREATININE 0.74 09/01/2017   BUN 11 09/01/2017   CO2 28 09/01/2017   TSH 3.54 09/01/2017          Assessment & Plan:

## 2017-09-01 NOTE — Assessment & Plan Note (Addendum)
Also for free t4 with labs, for phentermine asd

## 2017-09-01 NOTE — Patient Instructions (Signed)
OK to use the home steroid cream for the poison ivy  Please continue all other medications as before, and refills have been done if requested - the phentermine  Please have the pharmacy call with any other refills you may need.  Please continue your efforts at being more active, low cholesterol diet, and weight control.  You are otherwise up to date with prevention measures today.  Please keep your appointments with your specialists as you may have planned  Please go to the LAB in the Basement (turn left off the elevator) for the tests to be done today  You will be contacted by phone if any changes need to be made immediately.  Otherwise, you will receive a letter about your results with an explanation, but please check with MyChart first.  Please remember to sign up for MyChart if you have not done so, as this will be important to you in the future with finding out test results, communicating by private email, and scheduling acute appointments online when needed.  Please return in 1 year for your yearly visit, or sooner if needed, with Lab testing done 3-5 days before

## 2017-09-01 NOTE — Assessment & Plan Note (Signed)

## 2017-10-30 DIAGNOSIS — Z6830 Body mass index (BMI) 30.0-30.9, adult: Secondary | ICD-10-CM | POA: Diagnosis not present

## 2017-10-30 DIAGNOSIS — Z01419 Encounter for gynecological examination (general) (routine) without abnormal findings: Secondary | ICD-10-CM | POA: Diagnosis not present

## 2017-10-30 DIAGNOSIS — R82998 Other abnormal findings in urine: Secondary | ICD-10-CM | POA: Diagnosis not present

## 2017-12-26 ENCOUNTER — Other Ambulatory Visit: Payer: Self-pay | Admitting: Internal Medicine

## 2017-12-26 MED ORDER — ALPRAZOLAM 0.5 MG PO TABS: 0.5000 mg | ORAL_TABLET | Freq: Two times a day (BID) | ORAL | 5 refills | Status: DC | PRN

## 2017-12-26 NOTE — Telephone Encounter (Signed)
Copied from [place] [id number]. Topic: Quick Communication - Rx Refill/Question >> Dec 26, 2017  3:35 PM Chelsea Ortiz wrote: Medication:ALPRAZolam Duanne Moron) 0.5 MG tablet [[id number]]   Has the patient contacted their pharmacy?  Preferred Pharmacy (with phone number [facility] street name):[id number] [place], [place] [phone] (Phone) [phone] (Fax)   Agent: Please be advised that RX refills may take up to 3 business days. We ask that you follow-up with your pharmacy.

## 2017-12-26 NOTE — Telephone Encounter (Signed)
Done erx 

## 2018-01-07 ENCOUNTER — Telehealth: Payer: Self-pay | Admitting: Internal Medicine

## 2018-01-07 MED ORDER — ALPRAZOLAM 0.25 MG PO TABS: ORAL_TABLET | ORAL | 0 refills | Status: DC

## 2018-01-07 NOTE — Telephone Encounter (Signed)
Done erx 

## 2018-01-07 NOTE — Telephone Encounter (Signed)
Copied from [place] [phone]. Topic: Quick Communication - Rx Refill/Question >> Jan 07, 2018  8:28 AM Burchel, Abbi R wrote: Medication: amphetamine-dextroamphetamine (ADDERALL XR) 10 MG 24 hr capsule   Preferred [facility]acy:  [id number] [place], [place] [address] Phone: [phone] Fax: [phone]   Pt was  advised that RX refills may take up to 3 business days. We ask that you follow-up with your pharmacy.

## 2018-01-07 NOTE — Addendum Note (Signed)
Addended by: Biagio Borg on: 01/07/2018 09:01 AM   Modules accepted: Orders

## 2018-01-07 NOTE — Telephone Encounter (Signed)
Attempted to call patient and clarify her request for adderall xr 10 mg cap. This medication was discontinued in Jan 2019.  Left message for her to give [place] a call back.

## 2018-01-07 NOTE — Telephone Encounter (Signed)
Pt states pharmacy informed her this rx cannot be filled bc the 0.5 dosage is on back order and is unavailable.  Please advise.

## 2018-01-13 MED ORDER — AMPHETAMINE-DEXTROAMPHET ER 10 MG PO CP24: 10.0000 mg | ORAL_CAPSULE | Freq: Two times a day (BID) | ORAL | 0 refills | Status: DC

## 2018-01-13 NOTE — Telephone Encounter (Signed)
Done erx 

## 2018-01-13 NOTE — Telephone Encounter (Signed)
Patient called and says she hasn't been on Adderall in a while, since before the summer and she says she is feeling like she needs to start back taking it. So she would like the medication reordered. I advised I will send to Dr. Jenny Reichmann for approval.

## 2018-01-15 NOTE — Telephone Encounter (Signed)
[facility] [place] needs a prior authorization to refill the Adderall medication and they have sent a request over 3 times for it but have not heard anything back.  Please advise.  The patient is completely out of the medication.

## 2018-01-15 NOTE — Telephone Encounter (Signed)
Spoke with pharmacist. They will be faxing over forms to side b fax.

## 2018-01-15 NOTE — Telephone Encounter (Signed)
Patient states that [facility] [place] has been trying to reach Dr Jenny Reichmann regarding her ALPRAZolam Chelsea Ortiz) 0.25 MG tablet. It is on back order. Call back @ [phone]. Patient would like a call back today. If she does not answer, please leave a message and she will call back. Thanks.

## 2018-01-15 NOTE — Telephone Encounter (Signed)
Already addressed

## 2018-01-16 NOTE — Telephone Encounter (Signed)
approved through 01/17/2019.

## 2018-01-16 NOTE — Telephone Encounter (Signed)
Key: [id number]

## 2018-02-18 ENCOUNTER — Telehealth: Payer: Self-pay | Admitting: Internal Medicine

## 2018-02-18 MED ORDER — ADDERALL XR 10 MG PO CP24: 10.0000 mg | ORAL_CAPSULE | Freq: Two times a day (BID) | ORAL | 0 refills | Status: DC

## 2018-02-18 NOTE — Telephone Encounter (Signed)
Done erx 

## 2018-02-18 NOTE — Addendum Note (Signed)
Addended by: Biagio Borg on: 02/18/2018 01:36 PM   Modules accepted: Orders

## 2018-02-18 NOTE — Telephone Encounter (Signed)
Copied from [place] [phone]. Topic: General - Other >> Feb 18, 2018 10:43 AM Lennox Solders wrote: Reason for CRM:pt said the pharm told her to call md office. Pt need name brand adderall xr 10 mg. [facility]  [place]

## 2018-02-18 NOTE — Telephone Encounter (Signed)
See telephone encounter. Pt request brand name adderall.

## 2018-03-23 ENCOUNTER — Telehealth: Payer: Self-pay | Admitting: Internal Medicine

## 2018-03-23 MED ORDER — ADDERALL XR 10 MG PO CP24: 10.0000 mg | ORAL_CAPSULE | Freq: Two times a day (BID) | ORAL | 0 refills | Status: DC

## 2018-03-23 NOTE — Telephone Encounter (Signed)
Done erx 

## 2018-03-23 NOTE — Telephone Encounter (Signed)
Copied from [place] [phone]. Topic: Quick Communication - Rx Refill/Question >> Mar 23, 2018  9:54 AM Margot Ables wrote: Medication: ADDERALL XR 10 MG 24 hr capsule - 2 pills left (will take today)  Has the patient contacted their pharmacy? Yes - was told to contact MD Preferred Pharmacy (with phone number or street name[id number] [place], [place] [phone] (Phone) [phone] (Fax)  Last OV 09/01/17 advise to f/u 1 year No appt scheduled Last refill 02/18/2018 #60 no refills taking BID

## 2018-04-22 ENCOUNTER — Telehealth: Payer: Self-pay | Admitting: Internal Medicine

## 2018-04-22 MED ORDER — ADDERALL XR 10 MG PO CP24: 10.0000 mg | ORAL_CAPSULE | Freq: Two times a day (BID) | ORAL | 0 refills | Status: DC

## 2018-04-22 NOTE — Telephone Encounter (Signed)
Done erx 

## 2018-04-22 NOTE — Telephone Encounter (Signed)
Copied from [place] [phone]. Topic: Quick Communication - Rx Refill/Question >> Apr 22, 2018  2:27 PM Sallee Provencal, [place] B, NT wrote: Medication: ADDERALL XR 10 MG 24 hr capsule  Has the patient contacted their pharmacy? Yes.  To call office (Agent: If no, request that the patient contact the pharmacy for the refill.) (Agent: If yes, when and what did the pharmacy advise?)  Preferred Pharmacy (with phone number or street name): [id number] [place], [place]: Please be advised that RX refills may take up to 3 business days. We ask that you follow-up with your pharmacy.

## 2018-05-22 ENCOUNTER — Ambulatory Visit (INDEPENDENT_AMBULATORY_CARE_PROVIDER_SITE_OTHER): Payer: 59 | Admitting: Internal Medicine

## 2018-05-22 DIAGNOSIS — F419 Anxiety disorder, unspecified: Secondary | ICD-10-CM | POA: Diagnosis not present

## 2018-05-22 DIAGNOSIS — J309 Allergic rhinitis, unspecified: Secondary | ICD-10-CM | POA: Diagnosis not present

## 2018-05-22 DIAGNOSIS — M542 Cervicalgia: Secondary | ICD-10-CM | POA: Diagnosis not present

## 2018-05-22 MED ORDER — HYDROCODONE-ACETAMINOPHEN 5-325 MG PO TABS: 1.0000 | ORAL_TABLET | Freq: Four times a day (QID) | ORAL | 0 refills | Status: DC | PRN

## 2018-05-22 MED ORDER — PREDNISONE 10 MG PO TABS: ORAL_TABLET | ORAL | 0 refills | Status: DC

## 2018-05-22 NOTE — Progress Notes (Signed)
Patient ID: Chelsea Ortiz, female   DOB: [birth year], 48 y.o.   MRN: [id number]  Virtual Visit via Video Note  I connected with LAISHA RAU on 05/22/18 at  3:20 PM EDT by a video enabled telemedicine application and verified that I am speaking with the correct person using two identifiers.I am at office, and pt is at home, no other persons present   I discussed the limitations of evaluation and management by telemedicine and the availability of in person appointments. The patient expressed understanding and agreed to proceed.  History of Present Illness: Pt seen with c/o 5 days acute onset burning dull back pain that seems to maybe start at the lowest part of the posterior neck, but involving upper back to below the shoulder blades bilat, gradually worsening and not better with tramadol, flexeril and naproxen.  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 Horizontally.  Has done this for several years, maybe more recently to keep busy while the economy has slowed.  No falls, trauma, and Pt denies bowel or bladder change, fever, wt loss,  worsening UE or LE pain/numbness/weakness, gait change or falls. Pt denies fever, wt loss, night sweats, loss of appetite, or other constitutional symptoms  Does have hx of LS spine films from 2009 with l4-5 DJD.  Denies worsening depressive symptoms, suicidal ideation, or panic.  Has had some mild worsening allergic nasal congestion in last few wks, without HA, fever.     Past Medical History:  Diagnosis Date  . ADD (attention deficit disorder) 04/22/2011  . Allergic rhinitis, cause unspecified 11/08/2010  . Scoliosis of thoracic spine 04/22/2011   Past Surgical History:  Procedure Laterality Date  . child birth  02/23/2003  . TONSILLECTOMY  1982    reports that she has been  smoking cigarettes. She has been smoking about 0.50 packs per day. She has never used smokeless tobacco. She reports current drug use. She reports that she does not drink alcohol. family history includes Heart disease in an other family member; Hypertension in an other family member. Allergies  Allergen Reactions  . Penicillins     hives   Current Outpatient Medications on File Prior to Visit  Medication Sig Dispense Refill  . ADDERALL XR 10 MG 24 hr capsule Take 1 capsule (10 mg total) by mouth 2 (two) times daily. 60 capsule 0  . ALPRAZolam (XANAX) 0.25 MG tablet 1-2 tab by mouth twice per day as needed 120 tablet 0  . fluconazole (DIFLUCAN) 150 MG tablet Take 1 tablet (150 mg total) by mouth daily. 1 tablet 0  . ketorolac (ACULAR) 0.5 % ophthalmic solution Place 1 drop into the right eye 4 (four) times daily. 5 mL 0  . phentermine 37.5 MG capsule Take 1 capsule (37.5 mg total) by mouth every morning. 30 capsule 2   No current facility-administered medications on file prior to visit.    Observations/Objective: Alert, mentating well, mild nervous, not ill appearing, cn 2-12 intact, moves all 4s, resps normal, no visible rash or swelling, points to lowest c spine as moderately tender area Lab Results  Component Value Date   WBC 6.9 09/01/2017   HGB 13.5 09/01/2017   HCT 39.3 09/01/2017   PLT 285.0 09/01/2017   GLUCOSE 104 (H) 09/01/2017   CHOL 179 09/01/2017   TRIG 388.0 (H) 09/01/2017  HDL 38.60 (L) 09/01/2017   LDLDIRECT 104.0 09/01/2017   LDLCALC 91 11/08/2010   ALT 40 (H) 09/01/2017   AST 22 09/01/2017   NA 139 09/01/2017   K 4.5 09/01/2017   CL 103 09/01/2017   CREATININE 0.74 09/01/2017   BUN 11 09/01/2017   CO2 28 09/01/2017   TSH 3.54 09/01/2017   Assessment and Plan: See notes  Follow Up Instructions: See notes   I discussed the assessment and treatment plan with the patient. The patient was provided an opportunity to ask questions and all were answered. The  patient agreed with the plan and demonstrated an understanding of the instructions.   The patient was advised to call back or seek an in-person evaluation if the symptoms worsen or if the condition fails to improve as anticipated.   Cathlean Cower, MD

## 2018-05-23 ENCOUNTER — Encounter: Payer: Self-pay | Admitting: Internal Medicine

## 2018-05-23 NOTE — Assessment & Plan Note (Signed)
stable overall by history and exam, recent data reviewed with pt, and pt to continue medical treatment as before,  to f/u any worsening symptoms or concerns  

## 2018-05-23 NOTE — Assessment & Plan Note (Signed)
Mod to severe subjective, high suspicion today for presence of underlying low cervical djd/ddd flare due to overuse at work. , no specific neuro changes, OK for limited rx vicodin prn, predpac asd, and pt to call in 3-5 days for persistent pain or worsening neuro s/s to consider MRI

## 2018-05-23 NOTE — Patient Instructions (Signed)
Please take all new medication as prescribed - the pain medication, and prednisone  OK for OTC nasacort as needed  Please continue all other medications as before, and refills have been done if requested.  Please have the pharmacy call with any other refills you may need.  Please keep your appointments with your specialists as you may have planned  Please call in 3-5 days if not improved or any worsening, to consider MRI of the neck

## 2018-05-23 NOTE — Assessment & Plan Note (Signed)
Mild, ok for nasacort otc,  to f/u any worsening symptoms or concerns

## 2018-05-25 ENCOUNTER — Telehealth: Payer: Self-pay | Admitting: Internal Medicine

## 2018-05-25 DIAGNOSIS — M542 Cervicalgia: Secondary | ICD-10-CM

## 2018-05-25 NOTE — Telephone Encounter (Signed)
Copied from [place] [phone]. Topic: General - Other >> May 25, 2018 11:36 AM Celene Kras A wrote: Reason for CRM: Pt called stating her pain has not gotten better since her appt on Friday. She is requesting to be contacted on what is best for her to do next. Please advise.

## 2018-05-25 NOTE — Telephone Encounter (Signed)
Chelsea Ortiz,  Please inform pt that she is unable to do an MRI without conservative treatment and we will refer her to an orthopedic

## 2018-05-25 NOTE — Telephone Encounter (Signed)
OK for MRI  - ordered as "urgent" so hopefully to be called soon

## 2018-05-25 NOTE — Telephone Encounter (Signed)
I will ask staff to arrange for Chelsea Ortiz to be seen per Dr Smith/sports medicine either virtual or in person  Staff to assist Chelsea Ortiz, cont all other same tx

## 2018-05-25 NOTE — Telephone Encounter (Signed)
Ok I see that the cost is a big issue  OK for ortho referral, though I have to say it may take some time due to the pandemic

## 2018-05-26 NOTE — Telephone Encounter (Signed)
Spoke to pt and I will send referral to [facility] and she is aware

## 2018-05-26 NOTE — Telephone Encounter (Signed)
I called pt and left a msg for her to contact me regarding the referral.

## 2018-06-18 ENCOUNTER — Telehealth: Payer: Self-pay | Admitting: Internal Medicine

## 2018-06-18 MED ORDER — ADDERALL XR 10 MG PO CP24: 10.0000 mg | ORAL_CAPSULE | Freq: Two times a day (BID) | ORAL | 0 refills | Status: DC

## 2018-06-18 NOTE — Addendum Note (Signed)
Addended by: Biagio Borg on: 06/18/2018 04:39 PM   Modules accepted: Orders

## 2018-06-18 NOTE — Telephone Encounter (Signed)
Done erx 

## 2018-06-18 NOTE — Telephone Encounter (Signed)
Copied from [place]. Topic: Quick Communication - Rx Refill/Question >> Jun 18, 2018  2:13 PM Wynetta Emery, [place] C wrote: Medication: ADDERALL XR 10 MG 24 hr capsule   Has the patient contacted their pharmacy? No  (Agent: If no, request that the patient contact the pharmacy for the refill.) (Agent: If yes, when and what did the pharmacy advise?)  Preferred Pharmacy (with phone number or street name): [id number] [place], [place]: Please be advised that RX refills may take up to 3 business days. We ask that you follow-up with your pharmacy.

## 2018-07-15 ENCOUNTER — Other Ambulatory Visit: Payer: Self-pay | Admitting: Internal Medicine

## 2018-07-16 NOTE — Telephone Encounter (Signed)
Done erx 

## 2018-07-30 ENCOUNTER — Telehealth: Payer: Self-pay | Admitting: Internal Medicine

## 2018-07-30 MED ORDER — ADDERALL XR 10 MG PO CP24: 10.0000 mg | ORAL_CAPSULE | Freq: Two times a day (BID) | ORAL | 0 refills | Status: DC

## 2018-07-30 NOTE — Telephone Encounter (Signed)
Medication: ADDERALL XR 10 MG 24 hr capsule  Has the patient contacted their pharmacy? no  Preferred Pharmacy (with phone number or street name):  [id number] [place], [place] [phone] (Phone) [phone] (Fax)   Agent: Please be advised that RX refills may take up to 3 business days. We ask that you follow-up with your pharmacy.

## 2018-07-30 NOTE — Telephone Encounter (Signed)
Done erx 

## 2018-07-30 NOTE — Addendum Note (Signed)
Addended by: Biagio Borg on: 07/30/2018 07:09 PM   Modules accepted: Orders

## 2018-09-02 ENCOUNTER — Telehealth: Payer: Self-pay | Admitting: *Deleted

## 2018-09-02 MED ORDER — ADDERALL XR 10 MG PO CP24: 10.0000 mg | ORAL_CAPSULE | Freq: Two times a day (BID) | ORAL | 0 refills | Status: DC

## 2018-09-02 NOTE — Telephone Encounter (Signed)
Done erx 

## 2018-09-02 NOTE — Telephone Encounter (Signed)
Notified pt rx sent to [facility]  

## 2018-09-02 NOTE — Telephone Encounter (Signed)
Rec'd call pt is requesting refill on her adderall.Marland KitchenJohny Ortiz

## 2018-10-14 ENCOUNTER — Other Ambulatory Visit: Payer: Self-pay | Admitting: Internal Medicine

## 2018-10-14 MED ORDER — ADDERALL XR 10 MG PO CP24: 10.0000 mg | ORAL_CAPSULE | Freq: Two times a day (BID) | ORAL | 0 refills | Status: DC

## 2018-10-14 NOTE — Telephone Encounter (Signed)
Copied from [place] [phone]. Topic: Quick Communication - Rx Refill/Question >> Oct 14, 2018 11:23 AM Rainey Pines A wrote: Medication: ADDERALL XR 10 MG 24 hr capsule   Has the patient contacted their pharmacy? Yes (Agent: If no, request that the patient contact the pharmacy for the refill.) (Agent: If yes, when and what did the pharmacy advise?)Contact PCP  Preferred Pharmacy (with phone number or street name): [id number] [place], [place] [phone] (Phone) [phone] (Fax)    Agent: Please be advised that RX refills may take up to 3 business days. We ask that you follow-up with your pharmacy.

## 2018-10-14 NOTE — Telephone Encounter (Signed)
Done erx 

## 2018-10-14 NOTE — Telephone Encounter (Signed)
Requested medication (s) are due for refill today: yes  Requested medication (s) are on the active medication list: yes  Last refill:  09/02/2018  Future visit scheduled: no  Notes to clinic: This refill cannot be delegated   Requested Prescriptions  Pending Prescriptions Disp Refills   ADDERALL XR 10 MG 24 hr capsule 60 capsule 0    Sig: Take 1 capsule (10 mg total) by mouth 2 (two) times daily.     Not Delegated - Psychiatry:  Stimulants/ADHD Failed - 10/14/2018 11:38 AM      Failed - This refill cannot be delegated      Failed - Urine Drug Screen completed in last 360 days.      Failed - Valid encounter within last 3 months    Recent Outpatient Visits          4 months ago Neck pain, acute   [facility] -Georges Mouse, MD   1 year ago Preventative health care   [facility] -Georges Mouse, MD   1 year ago Nonallopathic lesion of lumbosacral region   [place], [place], DO   1 year ago Thoracic back pain, unspecified back pain laterality, unspecified chronicity   [place], Olevia Bowens, DO   1 year ago Preventative health care   [facility] -Georges Mouse, MD

## 2018-12-01 ENCOUNTER — Other Ambulatory Visit (INDEPENDENT_AMBULATORY_CARE_PROVIDER_SITE_OTHER): Payer: 59

## 2018-12-01 ENCOUNTER — Other Ambulatory Visit: Payer: Self-pay

## 2018-12-01 ENCOUNTER — Encounter: Payer: Self-pay | Admitting: Internal Medicine

## 2018-12-01 ENCOUNTER — Ambulatory Visit (INDEPENDENT_AMBULATORY_CARE_PROVIDER_SITE_OTHER): Payer: 59 | Admitting: Internal Medicine

## 2018-12-01 VITALS — BP 114/76 | HR 68 | Temp 98.7°F | Ht 61.0 in | Wt 172.0 lb

## 2018-12-01 DIAGNOSIS — E041 Nontoxic single thyroid nodule: Secondary | ICD-10-CM

## 2018-12-01 DIAGNOSIS — E611 Iron deficiency: Secondary | ICD-10-CM

## 2018-12-01 DIAGNOSIS — Z0000 Encounter for general adult medical examination without abnormal findings: Secondary | ICD-10-CM | POA: Diagnosis not present

## 2018-12-01 DIAGNOSIS — Z0001 Encounter for general adult medical examination with abnormal findings: Secondary | ICD-10-CM | POA: Diagnosis not present

## 2018-12-01 DIAGNOSIS — R739 Hyperglycemia, unspecified: Secondary | ICD-10-CM

## 2018-12-01 DIAGNOSIS — E538 Deficiency of other specified B group vitamins: Secondary | ICD-10-CM

## 2018-12-01 DIAGNOSIS — E559 Vitamin D deficiency, unspecified: Secondary | ICD-10-CM

## 2018-12-01 DIAGNOSIS — I6521 Occlusion and stenosis of right carotid artery: Secondary | ICD-10-CM | POA: Insufficient documentation

## 2018-12-01 DIAGNOSIS — E785 Hyperlipidemia, unspecified: Secondary | ICD-10-CM

## 2018-12-01 HISTORY — DX: Nontoxic single thyroid nodule: E04.1

## 2018-12-01 LAB — CBC WITH DIFFERENTIAL/PLATELET
Basophils Absolute: 0 10*3/uL (ref 0.0–0.1)
Basophils Relative: 0.6 % (ref 0.0–3.0)
Eosinophils Absolute: 0 10*3/uL (ref 0.0–0.7)
Eosinophils Relative: 0.7 % (ref 0.0–5.0)
HCT: 42 % (ref 36.0–46.0)
Hemoglobin: 14 g/dL (ref 12.0–15.0)
Lymphocytes Relative: 25.9 % (ref 12.0–46.0)
Lymphs Abs: 1.7 10*3/uL (ref 0.7–4.0)
MCHC: 33.3 g/dL (ref 30.0–36.0)
MCV: 94 fl (ref 78.0–100.0)
Monocytes Absolute: 0.6 10*3/uL (ref 0.1–1.0)
Monocytes Relative: 9 % (ref 3.0–12.0)
Neutro Abs: 4.3 10*3/uL (ref 1.4–7.7)
Neutrophils Relative %: 63.8 % (ref 43.0–77.0)
Platelets: 299 10*3/uL (ref 150.0–400.0)
RBC: 4.46 Mil/uL (ref 3.87–5.11)
RDW: 13.6 % (ref 11.5–15.5)
WBC: 6.7 10*3/uL (ref 4.0–10.5)

## 2018-12-01 LAB — LIPID PANEL
Cholesterol: 181 mg/dL (ref 0–200)
HDL: 40.6 mg/dL (ref >39.00)
NonHDL: 140.09
Total CHOL/HDL Ratio: 4
Triglycerides: 218 mg/dL — ABNORMAL HIGH (ref 0.0–149.0)
VLDL: 43.6 mg/dL — ABNORMAL HIGH (ref 0.0–40.0)

## 2018-12-01 LAB — URINALYSIS, ROUTINE W REFLEX MICROSCOPIC
Bilirubin Urine: NEGATIVE
Ketones, ur: NEGATIVE
Leukocytes,Ua: NEGATIVE
Nitrite: NEGATIVE
Specific Gravity, Urine: >=1.03 — AB (ref 1.000–1.030)
Total Protein, Urine: NEGATIVE
Urine Glucose: NEGATIVE
Urobilinogen, UA: 0.2 (ref 0.0–1.0)
pH: 5 (ref 5.0–8.0)

## 2018-12-01 LAB — HEPATIC FUNCTION PANEL
ALT: 15 U/L (ref 0–35)
AST: 14 U/L (ref 0–37)
Albumin: 4.4 g/dL (ref 3.5–5.2)
Alkaline Phosphatase: 49 U/L (ref 39–117)
Bilirubin, Direct: 0 mg/dL (ref 0.0–0.3)
Total Bilirubin: 0.4 mg/dL (ref 0.2–1.2)
Total Protein: 7.2 g/dL (ref 6.0–8.3)

## 2018-12-01 LAB — IBC PANEL
Iron: 136 ug/dL (ref 42–145)
Saturation Ratios: 44.4 % (ref 20.0–50.0)
Transferrin: 219 mg/dL (ref 212.0–360.0)

## 2018-12-01 LAB — BASIC METABOLIC PANEL
BUN: 10 mg/dL (ref 6–23)
CO2: 28 mEq/L (ref 19–32)
Calcium: 9.3 mg/dL (ref 8.4–10.5)
Chloride: 105 mEq/L (ref 96–112)
Creatinine, Ser: 0.68 mg/dL (ref 0.40–1.20)
GFR: 92.7 mL/min (ref >60.00)
Glucose, Bld: 87 mg/dL (ref 70–99)
Potassium: 4.5 mEq/L (ref 3.5–5.1)
Sodium: 138 mEq/L (ref 135–145)

## 2018-12-01 LAB — HEMOGLOBIN A1C: Hgb A1c MFr Bld: 5.5 % (ref 4.6–6.5)

## 2018-12-01 LAB — LDL CHOLESTEROL, DIRECT: Direct LDL: 107 mg/dL

## 2018-12-01 NOTE — Progress Notes (Signed)
Subjective:    Patient ID: Chelsea Ortiz, female    DOB: Oct 21, 1970, 48 y.o.   MRN: [id number]  HPI  Here for wellness and f/u;  Overall doing ok;  Pt denies Chest pain, worsening SOB, DOE, wheezing, orthopnea, PND, worsening LE edema, palpitations, dizziness or syncope.  Pt denies neurological change such as new headache, facial or extremity weakness.  Pt denies polydipsia, polyuria, or low sugar symptoms. Pt states overall good compliance with treatment and medications, good tolerability, and has been trying to follow appropriate diet.  Pt denies worsening depressive symptoms, suicidal ideation or panic. No fever, night sweats, wt loss, loss of appetite, or other constitutional symptoms.  Pt states good ability with ADL's, has low fall risk, home safety reviewed and adequate, no other significant changes in hearing or vision, and only occasionally active with exercise. Also, Has seen [facility] recently with spine films and asking for further evaluation. Denies hyper or hypo thyroid symptoms such as voice, skin or hair change., though does have right sided nodule. Wt Readings from Last 3 Encounters:  12/01/18 172 lb (78 kg)  09/01/17 169 lb (76.7 kg)  03/09/17 169 lb (76.7 kg)   Past Medical History:  Diagnosis Date  . ADD (attention deficit disorder) 04/22/2011  . Allergic rhinitis, cause unspecified 11/08/2010  . Scoliosis of thoracic spine 04/22/2011  . Thyroid nodule 12/01/2018   Past Surgical History:  Procedure Laterality Date  . child birth  02/23/2003  . TONSILLECTOMY  1982    reports that she has been smoking cigarettes. She has been smoking about 0.50 packs per day. She has never used smokeless tobacco. She reports current drug use. She reports that she does not drink alcohol. family history includes Heart disease in an other family member; Hypertension in an other family member. Allergies  Allergen Reactions  . Penicillins     hives   Current Outpatient Medications on File  Prior to Visit  Medication Sig Dispense Refill  . ALPRAZolam (XANAX) 0.25 MG tablet 1-2 tab by mouth twice per day as needed 120 tablet 0  . ALPRAZolam (XANAX) 0.5 MG tablet TAKE 1 TABLET (0.5 MG TOTAL) BY MOUTH 2 (TWO) TIMES DAILY AS NEEDED. 60 tablet 5  . HYDROcodone-acetaminophen (NORCO/VICODIN) 5-325 MG tablet Take 1 tablet by mouth every 6 (six) hours as needed. 30 tablet 0  . ketorolac (ACULAR) 0.5 % ophthalmic solution Place 1 drop into the right eye 4 (four) times daily. 5 mL 0  . phentermine 37.5 MG capsule Take 1 capsule (37.5 mg total) by mouth every morning. 30 capsule 2   No current facility-administered medications on file prior to visit.    Review of Systems  Constitutional: Negative for other unusual diaphoresis or sweats HENT: Negative for ear discharge or swelling Eyes: Negative for other worsening visual disturbances Respiratory: Negative for stridor or other swelling  Gastrointestinal: Negative for worsening distension or other blood Genitourinary: Negative for retention or other urinary change Musculoskeletal: Negative for other MSK pain or swelling Skin: Negative for color change or other new lesions Neurological: Negative for worsening tremors and other numbness  Psychiatric/Behavioral: Negative for worsening agitation or other fatigue All otherwise neg per pt    Objective:   Physical Exam BP 114/76   Pulse 68   Temp 98.7 F (37.1 C) (Oral)   Ht 5\' 1"  (1.549 m)   Wt 172 lb (78 kg)   SpO2 97%   BMI 32.50 kg/m  VS noted,  Constitutional: Pt appears in NAD  HENT: Head: NCAT.  Right Ear: External ear normal.  Left Ear: External ear normal.  Eyes: . Pupils are equal, round, and reactive to light. Conjunctivae and EOM are normal Nose: without d/c or deformity Neck: Neck supple. Gross normal ROM with thyroid nodule on right large non tender Cardiovascular: Normal rate and regular rhythm.   Pulmonary/Chest: Effort normal and breath sounds without rales or  wheezing.  Abd:  Soft, NT, ND, + BS, no organomegaly Neurological: Pt is alert. At baseline orientation, motor grossly intact Skin: Skin is warm. No rashes, other new lesions, no LE edema Psychiatric: Pt behavior is normal without agitation  All otherwise neg per pt Lab Results  Component Value Date   WBC 6.7 12/01/2018   HGB 14.0 12/01/2018   HCT 42.0 12/01/2018   PLT 299.0 12/01/2018   GLUCOSE 87 12/01/2018   CHOL 181 12/01/2018   TRIG 218.0 (H) 12/01/2018   HDL 40.60 12/01/2018   LDLDIRECT 107.0 12/01/2018   LDLCALC 91 11/08/2010   ALT 15 12/01/2018   AST 14 12/01/2018   NA 138 12/01/2018   K 4.5 12/01/2018   CL 105 12/01/2018   CREATININE 0.68 12/01/2018   BUN 10 12/01/2018   CO2 28 12/01/2018   TSH 6.59 (H) 12/01/2018   HGBA1C 5.5 12/01/2018       Assessment & Plan:

## 2018-12-01 NOTE — Patient Instructions (Signed)
Please continue all other medications as before, and refills have been done if requested.  Please have the pharmacy call with any other refills you may need.  Please continue your efforts at being more active, low cholesterol diet, and weight control.  You are otherwise up to date with prevention measures today.  Please keep your appointments with your specialists as you may have planned  You will be contacted regarding the referral for: thyroid ultrasound,and carotid artery ultrasound (which appear we have to have done at 2 different locations)  Please go to the LAB in the Basement (turn left off the elevator) for the tests to be done today  You will be contacted by phone if any changes need to be made immediately.  Otherwise, you will receive a letter about your results with an explanation, but please check with MyChart first.  Please remember to sign up for MyChart if you have not done so, as this will be important to you in the future with finding out test results, communicating by private email, and scheduling acute appointments online when needed.  Please return in 1 year for your yearly visit, or sooner if needed, with Lab testing done 3-5 days before

## 2018-12-03 LAB — VITAMIN B12: Vitamin B-12: 262 pg/mL (ref 211–911)

## 2018-12-03 LAB — VITAMIN D 25 HYDROXY (VIT D DEFICIENCY, FRACTURES): VITD: 31.68 ng/mL (ref 30.00–100.00)

## 2018-12-03 LAB — TSH: TSH: 6.59 u[IU]/mL — ABNORMAL HIGH (ref 0.35–4.50)

## 2018-12-04 ENCOUNTER — Telehealth: Payer: Self-pay | Admitting: Internal Medicine

## 2018-12-04 MED ORDER — ADDERALL XR 10 MG PO CP24: 10.0000 mg | ORAL_CAPSULE | Freq: Two times a day (BID) | ORAL | 0 refills | Status: DC

## 2018-12-04 NOTE — Telephone Encounter (Signed)
Done erx 

## 2018-12-04 NOTE — Telephone Encounter (Signed)
Pt need refill on adderall xr 10mg . [facility] [place]

## 2018-12-05 ENCOUNTER — Encounter: Payer: Self-pay | Admitting: Internal Medicine

## 2018-12-05 NOTE — Assessment & Plan Note (Signed)
stable overall by history and exam, recent data reviewed with pt, and pt to continue medical treatment as before,  to f/u any worsening symptoms or concerns  

## 2018-12-05 NOTE — Assessment & Plan Note (Signed)
For f/u thyroid u/s

## 2018-12-05 NOTE — Assessment & Plan Note (Addendum)
Suspect possibl carotid ca+2 - for carotid studies,  to f/u any worsening symptoms or concerns  In addition to the time spent performing CPE, I spent an additional 15 minutes face to face,in which greater than 50% of this time was spent in counseling and coordination of care for patient's illness as documented, including the differential dx, treatment, further evaluation and other management of cacification, thyroid nodule, hyperglycemia, HLD

## 2018-12-05 NOTE — Assessment & Plan Note (Signed)

## 2018-12-07 ENCOUNTER — Telehealth: Payer: Self-pay | Admitting: *Deleted

## 2018-12-07 DIAGNOSIS — I6529 Occlusion and stenosis of unspecified carotid artery: Secondary | ICD-10-CM

## 2018-12-07 NOTE — Telephone Encounter (Signed)
Copied from [place] [id number]. Topic: General - Inquiry >> Dec 07, 2018  9:38 AM Richardo Priest, NT wrote: Reason for CRM: Pt called in stating she would like the carotid artery test to be done with [facility] instead so she can have everything done when she goes for thyroid test. Please advise and send order.

## 2018-12-07 NOTE — Telephone Encounter (Signed)
Done per orders

## 2018-12-10 ENCOUNTER — Ambulatory Visit
Admission: RE | Admit: 2018-12-10 | Discharge: 2018-12-10 | Disposition: A | Discharge status: Home / Self Care | Payer: 59 | Source: Ambulatory Visit | Attending: Internal Medicine | Admitting: Internal Medicine

## 2018-12-10 DIAGNOSIS — E041 Nontoxic single thyroid nodule: Secondary | ICD-10-CM

## 2018-12-10 NOTE — Addendum Note (Signed)
Addended by: Cresenciano Lick on: 12/10/2018 01:19 PM   Modules accepted: Orders

## 2018-12-15 ENCOUNTER — Ambulatory Visit (HOSPITAL_COMMUNITY)
Admission: RE | Admit: 2018-12-15 | Payer: 59 | Source: Ambulatory Visit | Attending: Internal Medicine | Admitting: Internal Medicine

## 2019-02-19 ENCOUNTER — Other Ambulatory Visit: Payer: Self-pay | Admitting: Internal Medicine

## 2019-02-19 NOTE — Telephone Encounter (Signed)
Done erx 

## 2019-04-08 ENCOUNTER — Telehealth: Payer: Self-pay | Admitting: Internal Medicine

## 2019-04-08 NOTE — Telephone Encounter (Signed)
Patient is requesting a refill of the following medication ADDERALL XR 10 MG 24 hr capsule Pharmacy on file.   But patient is requesting for medication not to be XR, she is aware she may need to set up an appointment to discuss the medication change.

## 2019-04-09 MED ORDER — ADDERALL XR 10 MG PO CP24: 10.0000 mg | ORAL_CAPSULE | Freq: Two times a day (BID) | ORAL | 0 refills | Status: DC

## 2019-04-09 NOTE — Telephone Encounter (Signed)
Last filled on 12/07/2018  Last OV with PCP was 12/01/2018 Next OV with PCP is: Not on file  Please advise on refill of adderall 10 mg capsule

## 2019-04-09 NOTE — Telephone Encounter (Signed)
Done erx 

## 2019-04-12 NOTE — Telephone Encounter (Signed)
Pt is requesting to change the Adderall to (Not time released) regular Adderall.

## 2019-04-12 NOTE — Telephone Encounter (Signed)
New Message:   Pt is calling just to make sure her adderol is not time released. Please advise.

## 2019-04-13 MED ORDER — AMPHETAMINE-DEXTROAMPHETAMINE 10 MG PO TABS: 10.0000 mg | ORAL_TABLET | Freq: Two times a day (BID) | ORAL | 0 refills | Status: DC

## 2019-04-13 NOTE — Addendum Note (Signed)
Addended by: Biagio Borg on: 04/13/2019 01:11 PM   Modules accepted: Orders

## 2019-04-13 NOTE — Telephone Encounter (Signed)
Ok this is done 

## 2019-04-13 NOTE — Telephone Encounter (Signed)
Pt informed rx has been sent in.  

## 2019-05-17 ENCOUNTER — Telehealth: Payer: Self-pay

## 2019-05-17 MED ORDER — AMPHETAMINE-DEXTROAMPHETAMINE 10 MG PO TABS: 10.0000 mg | ORAL_TABLET | Freq: Two times a day (BID) | ORAL | 0 refills | Status: DC

## 2019-05-17 NOTE — Telephone Encounter (Signed)
1.Medication Requested:amphetamine-dextroamphetamine (ADDERALL) 10 MG tablet  2. Pharmacy (Name, Street, City):[id number] [place], [place]  3. On Med List: Yes   4. Last Visit with PCP: 10.27.20   5. Next visit date with PCP: no appt is made at this time - patient voiced the rx was called in last month   Agent: Please be advised that RX refills may take up to 3 business days. We ask that you follow-up with your pharmacy.

## 2019-05-17 NOTE — Telephone Encounter (Signed)
rx sent to [facility]

## 2019-05-17 NOTE — Telephone Encounter (Signed)
Check [place] registry last filled Fill Date 04/13/2019. MD is out of the office this week. pls advise on refill.Marland KitchenJohny Ortiz

## 2019-06-03 DIAGNOSIS — A6000 Herpesviral infection of urogenital system, unspecified: Secondary | ICD-10-CM | POA: Insufficient documentation

## 2019-06-21 ENCOUNTER — Telehealth: Payer: Self-pay | Admitting: Internal Medicine

## 2019-06-21 NOTE — Telephone Encounter (Signed)
    1.Medication Requested:amphetamine-dextroamphetamine (ADDERALL) 10 MG tablet  2. Pharmacy (Name, Street, City):[id number] [place], [place]  3. On Med List: yes 4. Last Visit with PCP: 12/01/18  5. Next visit date with PCP: declined to schedule appointment at this time   Agent: Please be advised that RX refills may take up to 3 business days. We ask that you follow-up with your pharmacy.

## 2019-06-24 MED ORDER — AMPHETAMINE-DEXTROAMPHETAMINE 10 MG PO TABS: 10.0000 mg | ORAL_TABLET | Freq: Two times a day (BID) | ORAL | 0 refills | Status: DC

## 2019-06-24 NOTE — Telephone Encounter (Signed)
F/u   The patient calling to check on her prescription  Asking for a call back

## 2019-06-24 NOTE — Telephone Encounter (Signed)
Done erx 

## 2019-07-22 IMAGING — DX DG THORACIC SPINE 3V
3 series · 3 of 3 positions shown · non-contrast
Comparison: Chest radiograph on 03/27/2012

CLINICAL DATA: Chronic thoracic back pain.

EXAM:
THORACIC SPINE - 3 VIEWS

[t-spine ap]
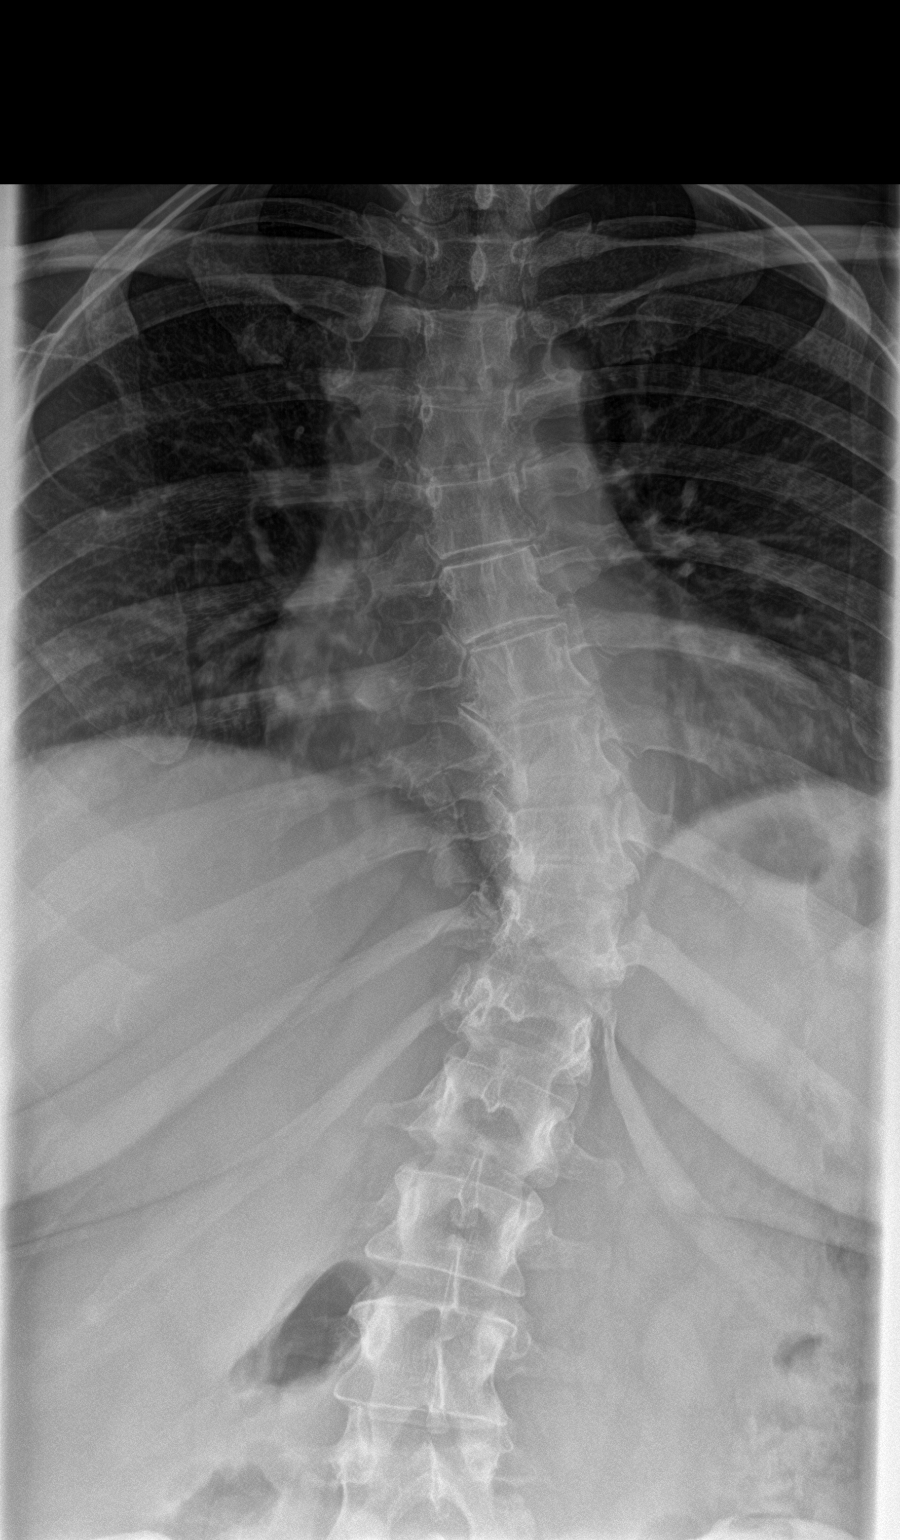

[t-spine lat]
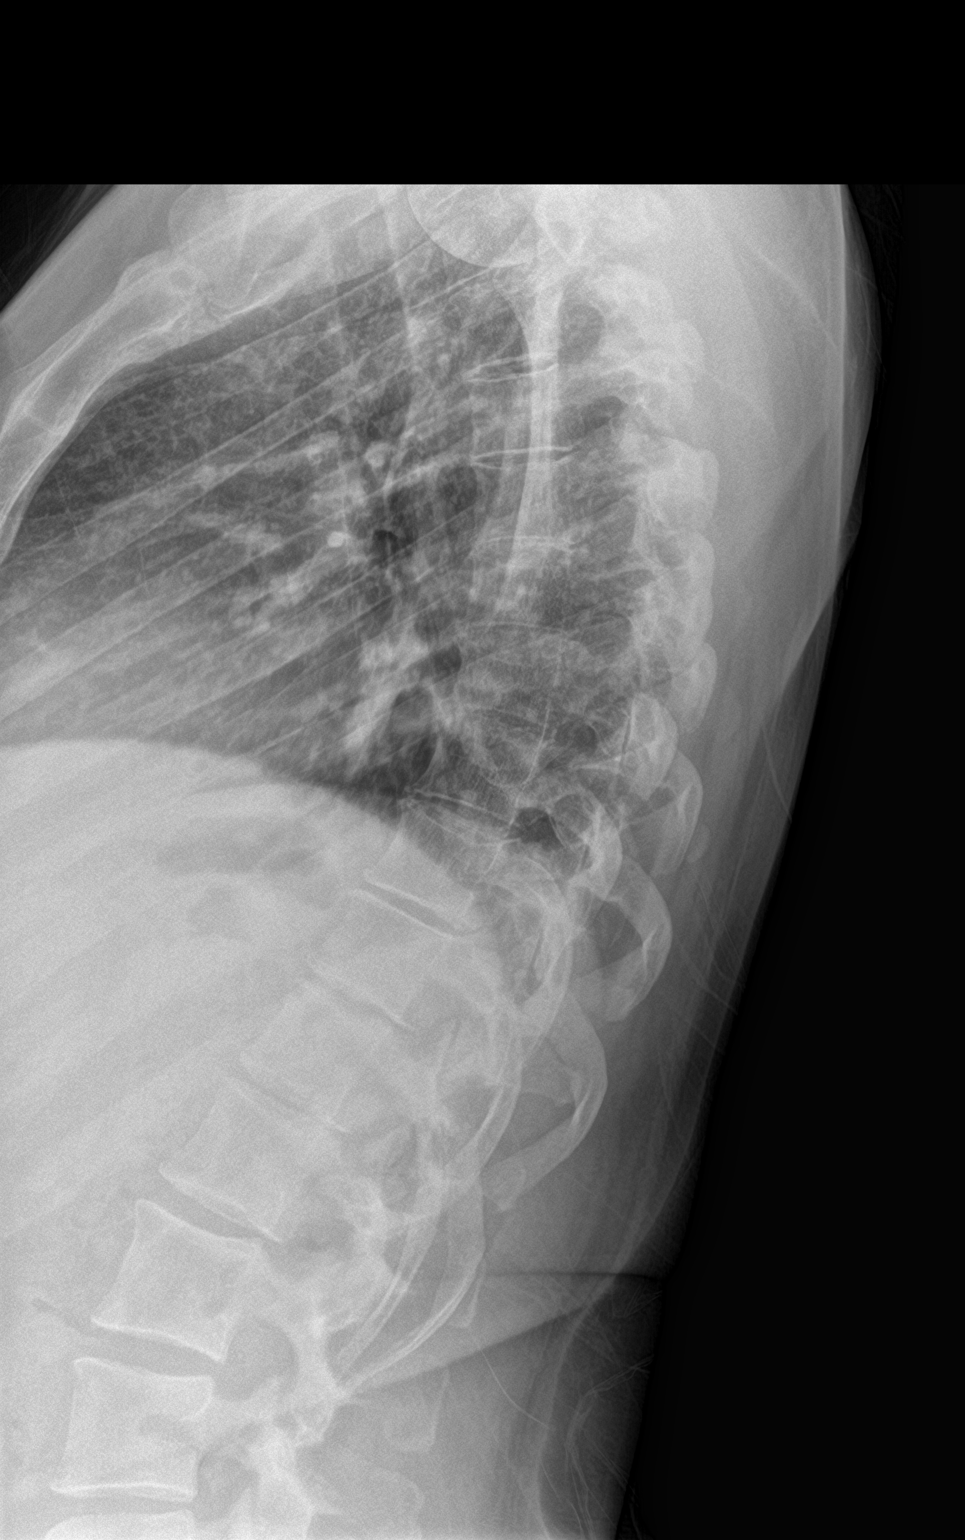

[swimmer]
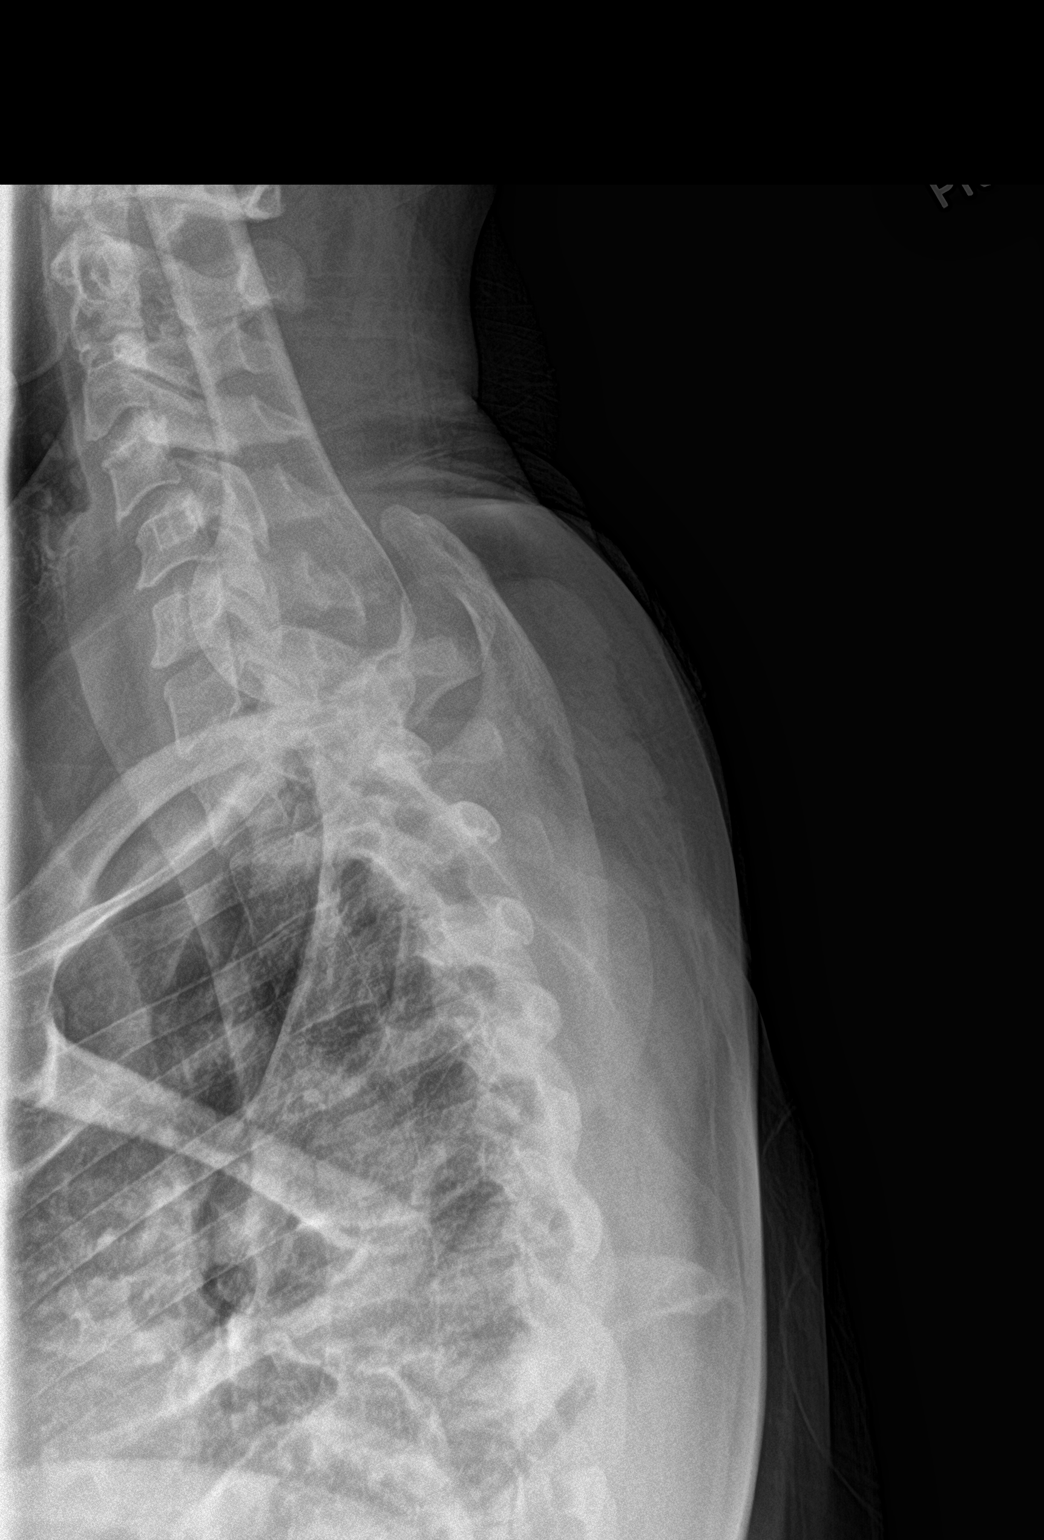

[3 of 3 positions shown; findings below may reference images not displayed]

FINDINGS: There is no evidence of thoracic spine fracture. Alignment is
normal. Intervertebral disc spaces are maintained. No focal lytic or
sclerotic bone lesions identified. Moderate lower thoracic
levoscoliosis is seen which shows no significant change compared to
previous chest radiograph in 4874.
IMPRESSION: No acute findings.

Moderate lower thoracic levoscoliosis, without significant change.

## 2019-07-22 IMAGING — DX DG LUMBAR SPINE COMPLETE 4+V
5 series · 5 of 5 positions shown · non-contrast
Comparison: None.

CLINICAL DATA: Chronic low back pain.  No known injury.

EXAM:
LUMBAR SPINE - COMPLETE 4+ VIEW

[l-spine ap]
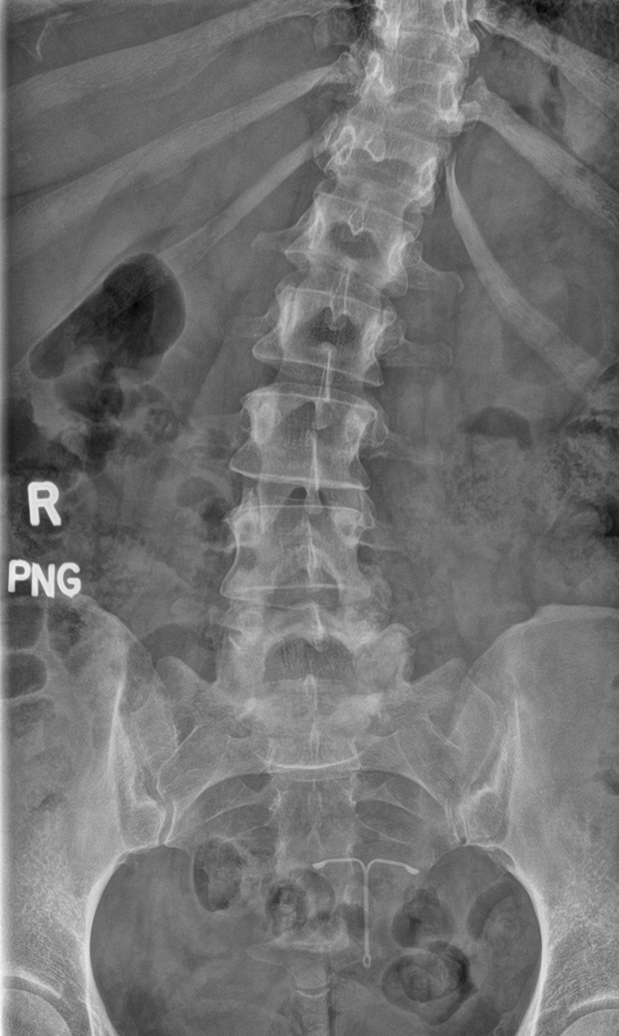

[l-spine obl (1 of 2)]
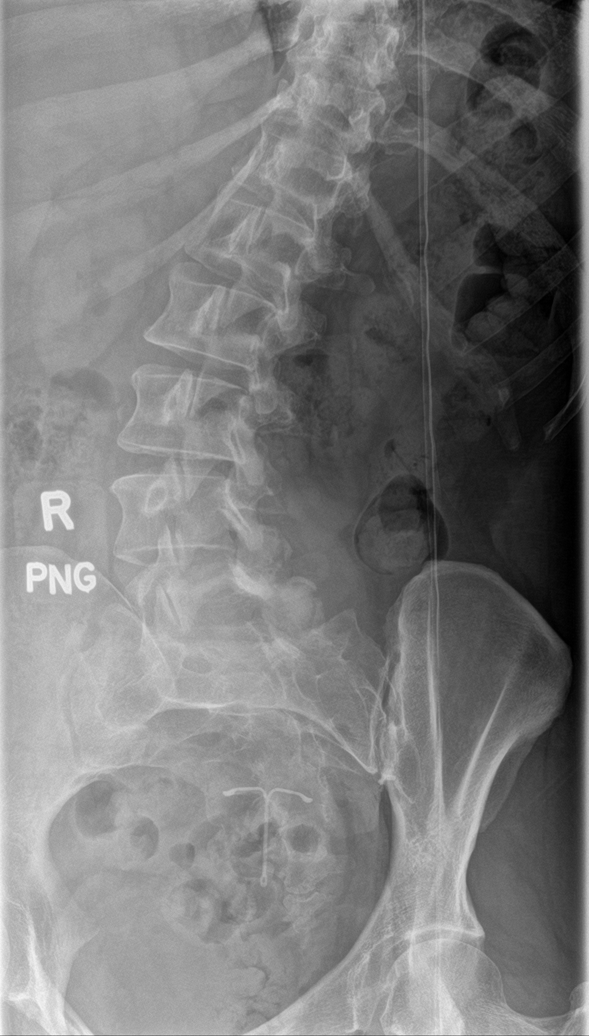

[l-spine obl (2 of 2)]
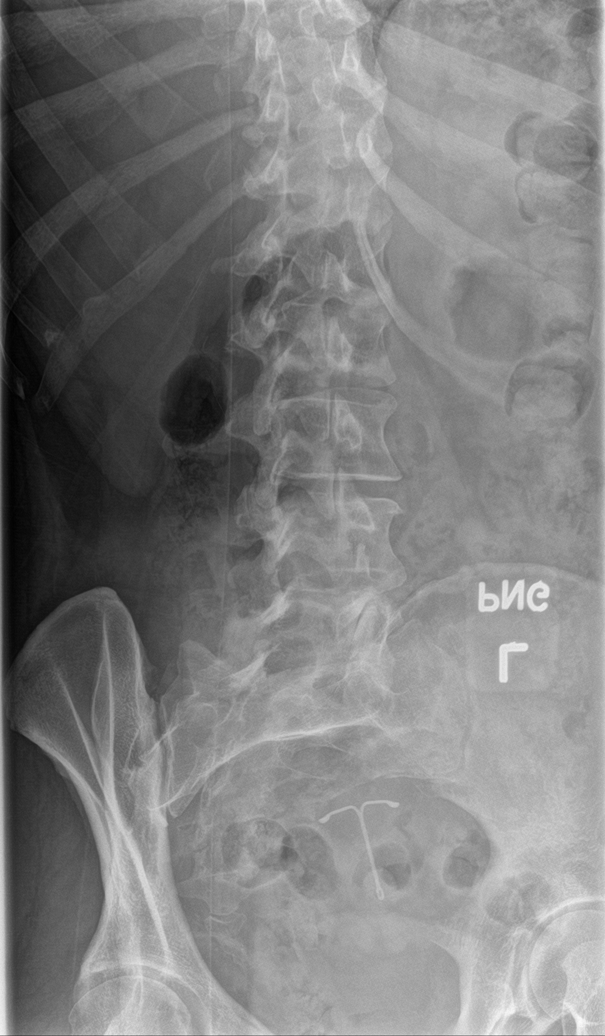

[l-spine lat]
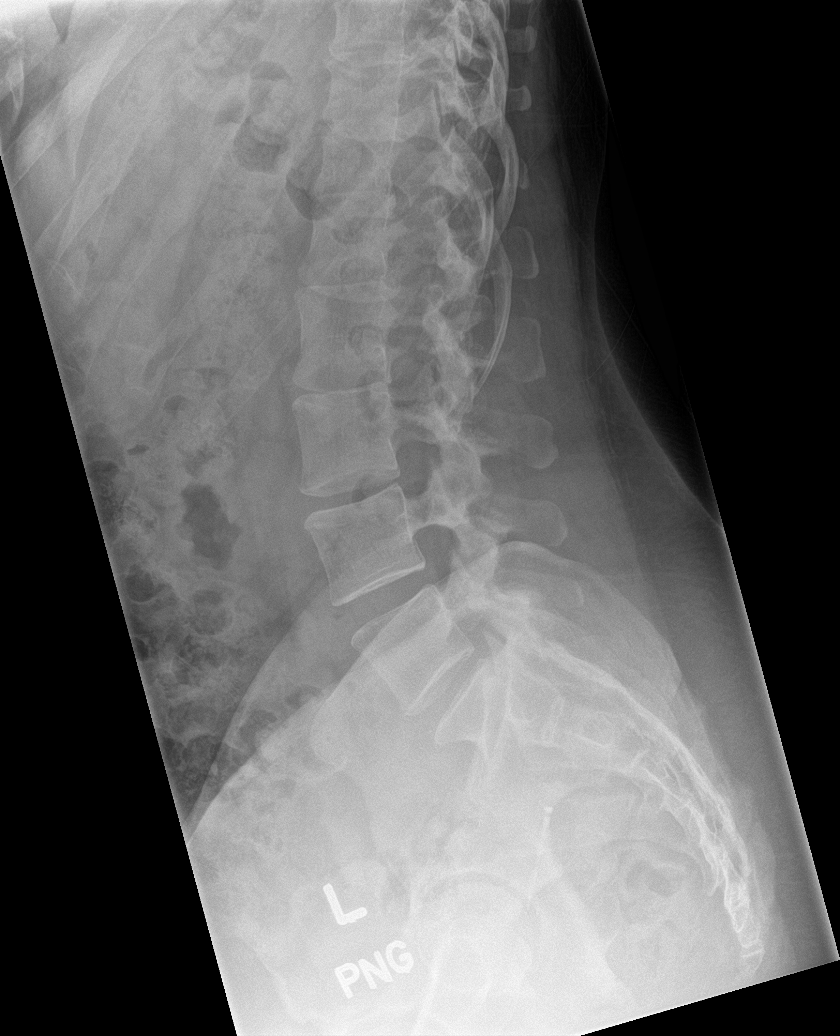

[l-spine spot]
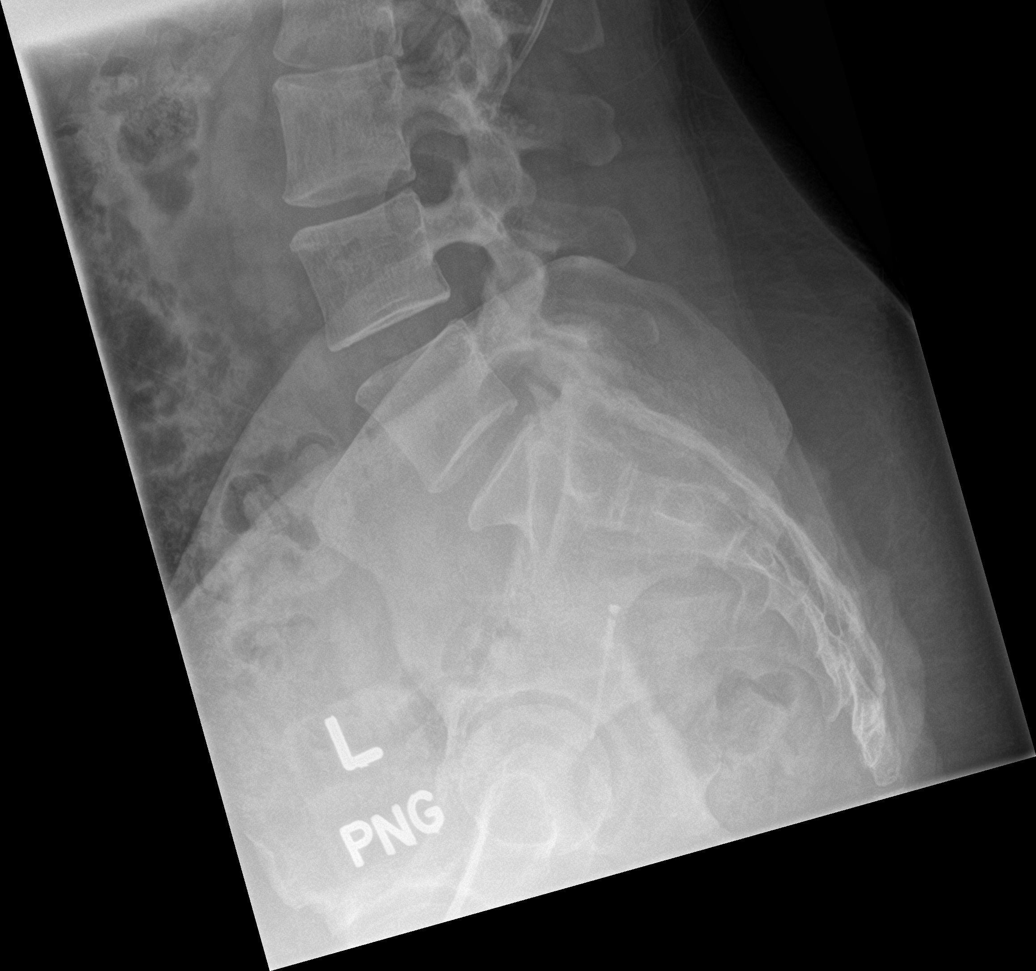

[5 of 5 positions shown; findings below may reference images not displayed]

FINDINGS: There is no evidence of lumbar spine fracture. Alignment is normal.
Intervertebral disc spaces are maintained. Mild to moderate facet
DJD is seen on the left at L5-S1. No other focal bone lesions
identified. IUD noted in pelvis.
IMPRESSION: No acute findings.  Left-sided facet DJD at L5-S1.

## 2019-08-12 ENCOUNTER — Telehealth: Payer: Self-pay | Admitting: Internal Medicine

## 2019-08-12 MED ORDER — AMPHETAMINE-DEXTROAMPHETAMINE 10 MG PO TABS: 10.0000 mg | ORAL_TABLET | Freq: Two times a day (BID) | ORAL | 0 refills | Status: DC

## 2019-08-12 NOTE — Telephone Encounter (Signed)
Sent to Dr. John. 

## 2019-08-12 NOTE — Telephone Encounter (Signed)
   1.Medication Requested:amphetamine-dextroamphetamine (ADDERALL) 10 MG tablet  2. Pharmacy (Name, Street, City):[id number] [place], [place]  3. On Med List: yes  4. Last Visit with PCP: 12/01/18  5. Next visit date with PCP: 08/26/19   Agent: Please be advised that RX refills may take up to 3 business days. We ask that you follow-up with your pharmacy.

## 2019-08-26 ENCOUNTER — Ambulatory Visit: Payer: 59 | Admitting: Internal Medicine

## 2019-08-26 ENCOUNTER — Other Ambulatory Visit: Payer: Self-pay

## 2019-08-26 ENCOUNTER — Encounter: Payer: Self-pay | Admitting: Internal Medicine

## 2019-08-26 VITALS — BP 110/80 | HR 67 | Temp 98.3°F | Ht 61.0 in | Wt 168.0 lb

## 2019-08-26 DIAGNOSIS — Z1159 Encounter for screening for other viral diseases: Secondary | ICD-10-CM

## 2019-08-26 DIAGNOSIS — Z0001 Encounter for general adult medical examination with abnormal findings: Secondary | ICD-10-CM

## 2019-08-26 DIAGNOSIS — E559 Vitamin D deficiency, unspecified: Secondary | ICD-10-CM

## 2019-08-26 DIAGNOSIS — Z0000 Encounter for general adult medical examination without abnormal findings: Secondary | ICD-10-CM

## 2019-08-26 DIAGNOSIS — R739 Hyperglycemia, unspecified: Secondary | ICD-10-CM

## 2019-08-26 DIAGNOSIS — E669 Obesity, unspecified: Secondary | ICD-10-CM

## 2019-08-26 DIAGNOSIS — E538 Deficiency of other specified B group vitamins: Secondary | ICD-10-CM

## 2019-08-26 LAB — COMPLETE METABOLIC PANEL WITH GFR
AG Ratio: 1.6 (calc) (ref 1.0–2.5)
ALT: 17 U/L (ref 6–29)
AST: 13 U/L (ref 10–35)
Albumin: 4.2 g/dL (ref 3.6–5.1)
Alkaline phosphatase (APISO): 53 U/L (ref 31–125)
BUN: 11 mg/dL (ref 7–25)
CO2: 31 mmol/L (ref 20–32)
Calcium: 9.5 mg/dL (ref 8.6–10.2)
Chloride: 101 mmol/L (ref 98–110)
Creat: 0.68 mg/dL (ref 0.50–1.10)
GFR, Est African American: 121 mL/min/{1.73_m2} (ref >=60)
GFR, Est Non African American: 104 mL/min/{1.73_m2} (ref >=60)
Globulin: 2.7 g/dL (calc) (ref 1.9–3.7)
Glucose, Bld: 80 mg/dL (ref 65–99)
Potassium: 5.6 mmol/L — ABNORMAL HIGH (ref 3.5–5.3)
Sodium: 137 mmol/L (ref 135–146)
Total Bilirubin: 0.5 mg/dL (ref 0.2–1.2)
Total Protein: 6.9 g/dL (ref 6.1–8.1)

## 2019-08-26 MED ORDER — PHENTERMINE HCL 37.5 MG PO CAPS: 37.5000 mg | ORAL_CAPSULE | ORAL | 2 refills | Status: DC

## 2019-08-26 MED ORDER — AMPHETAMINE-DEXTROAMPHETAMINE 10 MG PO TABS: 10.0000 mg | ORAL_TABLET | Freq: Two times a day (BID) | ORAL | 0 refills | Status: DC

## 2019-08-26 MED ORDER — ALPRAZOLAM 0.5 MG PO TABS: 0.5000 mg | ORAL_TABLET | Freq: Two times a day (BID) | ORAL | 5 refills | Status: DC | PRN

## 2019-08-26 NOTE — Patient Instructions (Signed)

## 2019-08-26 NOTE — Progress Notes (Signed)
Subjective:    Patient ID: Chelsea Ortiz, female    DOB: 08/05/1970, 49 y.o.   MRN: [id number]  HPI   Here for wellness and f/u;  Overall doing ok;  Pt denies Chest pain, worsening SOB, DOE, wheezing, orthopnea, PND, worsening LE edema, palpitations, dizziness or syncope.  Pt denies neurological change such as new headache, facial or extremity weakness.  Pt denies polydipsia, polyuria, or low sugar symptoms. Pt states overall good compliance with treatment and medications, good tolerability, and has been trying to follow appropriate diet.  Pt denies worsening depressive symptoms, suicidal ideation or panic. No fever, night sweats, wt loss, loss of appetite, or other constitutional symptoms.  Pt states good ability with ADL's, has low fall risk, home safety reviewed and adequate, no other significant changes in hearing or vision, and only occasionally active with exercise. Hard to lose wt Wt Readings from Last 3 Encounters:  08/26/19 168 lb (76.2 kg)  12/01/18 172 lb (78 kg)  09/01/17 169 lb (76.7 kg)   Past Medical History:  Diagnosis Date  . ADD (attention deficit disorder) 04/22/2011  . Allergic rhinitis, cause unspecified 11/08/2010  . Scoliosis of thoracic spine 04/22/2011  . Thyroid nodule 12/01/2018   Past Surgical History:  Procedure Laterality Date  . child birth  02/23/2003  . TONSILLECTOMY  1982    reports that she has been smoking cigarettes. She has been smoking about 0.50 packs per day. She has never used smokeless tobacco. She reports current drug use. She reports that she does not drink alcohol. family history includes Heart disease in an other family member; Hypertension in an other family member. Allergies  Allergen Reactions  . Penicillins     hives   Current Outpatient Medications on File Prior to Visit  Medication Sig Dispense Refill  . influenza vaccine (FLUCELVAX QUADRIVALENT) 0.5 ML injection Flucelvax Quad 2020-2021 (PF) 60 mcg (15 mcg x 4)/0.5 mL IM syringe      . ketorolac (ACULAR) 0.5 % ophthalmic solution Place 1 drop into the right eye 4 (four) times daily. 5 mL 0   No current facility-administered medications on file prior to visit.   Review of Systems All otherwise neg per pt    Objective:   Physical Exam BP 110/80 (BP Location: Left Arm, Patient Position: Sitting, Cuff Size: Large)   Pulse 67   Temp 98.3 F (36.8 C) (Oral)   Ht 5\' 1"  (1.549 m)   Wt 168 lb (76.2 kg)   SpO2 97%   BMI 31.74 kg/m  VS noted,  Constitutional: Pt appears in NAD HENT: Head: NCAT.  Right Ear: External ear normal.  Left Ear: External ear normal.  Eyes: . Pupils are equal, round, and reactive to light. Conjunctivae and EOM are normal Nose: without d/c or deformity Neck: Neck supple. Gross normal ROM Cardiovascular: Normal rate and regular rhythm.   Pulmonary/Chest: Effort normal and breath sounds without rales or wheezing.  Abd:  Soft, NT, ND, + BS, no organomegaly Neurological: Pt is alert. At baseline orientation, motor grossly intact Skin: Skin is warm. No rashes, other new lesions, no LE edema Psychiatric: Pt behavior is normal without agitation  All otherwise neg per pt Lab Results  Component Value Date   WBC 11.1 (H) 08/26/2019   HGB 14.4 08/26/2019   HCT 44.2 08/26/2019   PLT 326 08/26/2019   GLUCOSE 80 08/26/2019   CHOL 148 08/26/2019   TRIG 218 (H) 08/26/2019   HDL 46 (L) 08/26/2019   LDLDIRECT  107.0 12/01/2018   LDLCALC 70 08/26/2019   ALT 17 08/26/2019   AST 13 08/26/2019   NA 137 08/26/2019   K 5.6 (H) 08/26/2019   CL 101 08/26/2019   CREATININE 0.68 08/26/2019   BUN 11 08/26/2019   CO2 31 08/26/2019   TSH 7.34 (H) 08/26/2019   HGBA1C 5.4 08/26/2019      Assessment & Plan:

## 2019-08-27 ENCOUNTER — Other Ambulatory Visit: Payer: Self-pay | Admitting: Internal Medicine

## 2019-08-27 ENCOUNTER — Encounter: Payer: Self-pay | Admitting: Internal Medicine

## 2019-08-27 LAB — CBC WITH DIFFERENTIAL/PLATELET
Absolute Monocytes: 1010 cells/uL — ABNORMAL HIGH (ref 200–950)
Basophils Absolute: 67 cells/uL (ref 0–200)
Basophils Relative: 0.6 %
Eosinophils Absolute: 67 cells/uL (ref 15–500)
Eosinophils Relative: 0.6 %
HCT: 44.2 % (ref 35.0–45.0)
Hemoglobin: 14.4 g/dL (ref 11.7–15.5)
Lymphs Abs: 2797 cells/uL (ref 850–3900)
MCH: 30.1 pg (ref 27.0–33.0)
MCHC: 32.6 g/dL (ref 32.0–36.0)
MCV: 92.5 fL (ref 80.0–100.0)
MPV: 10.1 fL (ref 7.5–12.5)
Monocytes Relative: 9.1 %
Neutro Abs: 7160 cells/uL (ref 1500–7800)
Neutrophils Relative %: 64.5 %
Platelets: 326 10*3/uL (ref 140–400)
RBC: 4.78 10*6/uL (ref 3.80–5.10)
RDW: 12.9 % (ref 11.0–15.0)
Total Lymphocyte: 25.2 %
WBC: 11.1 10*3/uL — ABNORMAL HIGH (ref 3.8–10.8)

## 2019-08-27 LAB — HEMOGLOBIN A1C
Hgb A1c MFr Bld: 5.4 % of total Hgb (ref <5.7)
Mean Plasma Glucose: 108 (calc)
eAG (mmol/L): 6 (calc)

## 2019-08-27 LAB — TSH: TSH: 7.34 mIU/L — ABNORMAL HIGH

## 2019-08-27 LAB — URINALYSIS, ROUTINE W REFLEX MICROSCOPIC
Bilirubin Urine: NEGATIVE
Glucose, UA: NEGATIVE
Hgb urine dipstick: NEGATIVE
Ketones, ur: NEGATIVE
Leukocytes,Ua: NEGATIVE
Nitrite: NEGATIVE
Protein, ur: NEGATIVE
Specific Gravity, Urine: 1.022 (ref 1.001–1.03)
pH: 6 (ref 5.0–8.0)

## 2019-08-27 LAB — LIPID PANEL
Cholesterol: 148 mg/dL (ref <200)
HDL: 46 mg/dL — ABNORMAL LOW (ref >=50)
LDL Cholesterol (Calc): 70 mg/dL (calc)
Non-HDL Cholesterol (Calc): 102 mg/dL (calc) (ref <130)
Total CHOL/HDL Ratio: 3.2 (calc) (ref <5.0)
Triglycerides: 218 mg/dL — ABNORMAL HIGH (ref <150)

## 2019-08-27 LAB — VITAMIN D 25 HYDROXY (VIT D DEFICIENCY, FRACTURES): Vit D, 25-Hydroxy: 17 ng/mL — ABNORMAL LOW (ref 30–100)

## 2019-08-27 LAB — VITAMIN B12: Vitamin B-12: 379 pg/mL (ref 200–1100)

## 2019-08-27 MED ORDER — LEVOTHYROXINE SODIUM 25 MCG PO TABS: 25.0000 ug | ORAL_TABLET | Freq: Every day | ORAL | 3 refills | Status: DC

## 2019-08-27 MED ORDER — VITAMIN D (ERGOCALCIFEROL) 1.25 MG (50000 UNIT) PO CAPS: 50000.0000 [IU] | ORAL_CAPSULE | ORAL | 0 refills | Status: DC

## 2019-08-28 ENCOUNTER — Encounter: Payer: Self-pay | Admitting: Internal Medicine

## 2019-08-28 NOTE — Assessment & Plan Note (Signed)

## 2019-08-28 NOTE — Assessment & Plan Note (Signed)
stable overall by history and exam, recent data reviewed with pt, and pt to continue medical treatment as before,  to f/u any worsening symptoms or concerns  

## 2019-08-28 NOTE — Assessment & Plan Note (Signed)
[place] for phentermine 37.5 asd,  to f/u any worsening symptoms or concerns

## 2019-11-02 ENCOUNTER — Other Ambulatory Visit: Payer: Self-pay | Admitting: Internal Medicine

## 2019-11-02 NOTE — Telephone Encounter (Signed)
Please change to OTC Vitamin D3 at 2000 units per day, indefinitely.  

## 2019-11-15 ENCOUNTER — Telehealth: Payer: Self-pay | Admitting: Internal Medicine

## 2019-11-15 MED ORDER — AMPHETAMINE-DEXTROAMPHETAMINE 10 MG PO TABS: 10.0000 mg | ORAL_TABLET | Freq: Two times a day (BID) | ORAL | 0 refills | Status: DC

## 2019-11-15 NOTE — Telephone Encounter (Signed)
Done erx 

## 2019-11-15 NOTE — Telephone Encounter (Signed)
Sent to Dr. John. 

## 2019-11-15 NOTE — Telephone Encounter (Signed)
amphetamine-dextroamphetamine (ADDERALL) 10 MG tablet  [id number] [place], [place] - [place] Phone:  [phone]  Fax:  [phone]     Last appt: 7.22.21 Next appt: Not scheduled

## 2019-12-27 ENCOUNTER — Other Ambulatory Visit: Payer: Self-pay

## 2019-12-28 ENCOUNTER — Encounter: Payer: Self-pay | Admitting: Internal Medicine

## 2019-12-28 ENCOUNTER — Ambulatory Visit: Payer: 59 | Admitting: Internal Medicine

## 2019-12-28 ENCOUNTER — Ambulatory Visit (INDEPENDENT_AMBULATORY_CARE_PROVIDER_SITE_OTHER): Payer: 59

## 2019-12-28 VITALS — BP 118/80 | HR 78 | Temp 98.8°F | Ht 61.0 in | Wt 169.0 lb

## 2019-12-28 DIAGNOSIS — R739 Hyperglycemia, unspecified: Secondary | ICD-10-CM

## 2019-12-28 DIAGNOSIS — M79671 Pain in right foot: Secondary | ICD-10-CM | POA: Diagnosis not present

## 2019-12-28 DIAGNOSIS — F988 Other specified behavioral and emotional disorders with onset usually occurring in childhood and adolescence: Secondary | ICD-10-CM | POA: Diagnosis not present

## 2019-12-28 DIAGNOSIS — E039 Hypothyroidism, unspecified: Secondary | ICD-10-CM

## 2019-12-28 DIAGNOSIS — E041 Nontoxic single thyroid nodule: Secondary | ICD-10-CM

## 2019-12-28 LAB — TSH: TSH: 3.38 u[IU]/mL (ref 0.35–4.50)

## 2019-12-28 LAB — T4, FREE: Free T4: 0.65 ng/dL (ref 0.60–1.60)

## 2019-12-28 MED ORDER — HYDROCODONE-ACETAMINOPHEN 5-325 MG PO TABS: 1.0000 | ORAL_TABLET | Freq: Four times a day (QID) | ORAL | 0 refills | Status: DC | PRN

## 2019-12-28 MED ORDER — AMPHETAMINE-DEXTROAMPHETAMINE 10 MG PO TABS: 10.0000 mg | ORAL_TABLET | Freq: Two times a day (BID) | ORAL | 0 refills | Status: DC

## 2019-12-28 NOTE — Patient Instructions (Signed)
Please take all new medication as prescribed  - the pain medication as needed  You can also try the OTC salan paz topical patch for pain  Please continue all other medications as before, and refills have been done if requested - the adderall  Please have the pharmacy call with any other refills you may need.  Please continue your efforts at being more active, low cholesterol diet, and weight control.  Please keep your appointments with your specialists as you may have planned  You will be contacted regarding the referral for: Sports Medicine on the first floor  Please go to the XRAY Department in the first floor for the x-ray testing  Please go to the LAB at the blood drawing area for the tests to be done  You will be contacted by phone if any changes need to be made immediately.  Otherwise, you will receive a letter about your results with an explanation, but please check with MyChart first.  Please remember to sign up for MyChart if you have not done so, as this will be important to you in the future with finding out test results, communicating by private email, and scheduling acute appointments online when needed.

## 2019-12-28 NOTE — Progress Notes (Signed)
Subjective:    Patient ID: Chelsea Ortiz, female    DOB: 03/08/1970, 49 y.o.   MRN: [id number]  HPI  Here with c/o 1 wk right dorsal foot pain and swelling most near starting with the first and second toes, worse to walk, better to sit, mod to occasionally severe.  Denies hyper or hypo thyroid symptoms such as voice, skin or hair change.  ADD med working well, ask for refill.  Pt denies chest pain, increased sob or doe, wheezing, orthopnea, PND, increased LE swelling, palpitations, dizziness or syncope.  Pt denies new neurological symptoms such as new headache, or facial or extremity weakness or numbness   Pt denies polydipsia, polyuria Wt Readings from Last 3 Encounters:  12/28/19 169 lb (76.7 kg)  08/26/19 168 lb (76.2 kg)  12/01/18 172 lb (78 kg)   Past Medical History:  Diagnosis Date  . ADD (attention deficit disorder) 04/22/2011  . Allergic rhinitis, cause unspecified 11/08/2010  . Scoliosis of thoracic spine 04/22/2011  . Thyroid nodule 12/01/2018   Past Surgical History:  Procedure Laterality Date  . child birth  02/23/2003  . TONSILLECTOMY  1982    reports that she has been smoking cigarettes. She has been smoking about 0.50 packs per day. She has never used smokeless tobacco. She reports current drug use. She reports that she does not drink alcohol. family history includes Heart disease in an other family member; Hypertension in an other family member. Allergies  Allergen Reactions  . Penicillins     hives   Current Outpatient Medications on File Prior to Visit  Medication Sig Dispense Refill  . ALPRAZolam (XANAX) 0.5 MG tablet Take 1 tablet (0.5 mg total) by mouth 2 (two) times daily as needed. 60 tablet 5  . influenza vaccine (FLUCELVAX QUADRIVALENT) 0.5 ML injection Flucelvax Quad 2020-2021 (PF) 60 mcg (15 mcg x 4)/0.5 mL IM syringe    . ketorolac (ACULAR) 0.5 % ophthalmic solution Place 1 drop into the right eye 4 (four) times daily. 5 mL 0  . levothyroxine (SYNTHROID)  25 MCG tablet Take 1 tablet (25 mcg total) by mouth daily before breakfast. 90 tablet 3  . phentermine 37.5 MG capsule Take 1 capsule (37.5 mg total) by mouth every morning. 30 capsule 2   No current facility-administered medications on file prior to visit.   Review of Systems All otherwise neg per pt    Objective:   Physical Exam BP 118/80 (BP Location: Left Arm, Patient Position: Sitting, Cuff Size: Large)   Pulse 78   Temp 98.8 F (37.1 C) (Oral)   Ht 5\' 1"  (1.549 m)   Wt 169 lb (76.7 kg)   SpO2 97%   BMI 31.93 kg/m  VS noted,  Constitutional: Pt appears in NAD HENT: Head: NCAT.  Right Ear: External ear normal.  Left Ear: External ear normal.  Eyes: . Pupils are equal, round, and reactive to light. Conjunctivae and EOM are normal Nose: without d/c or deformity Neck: Neck supple. Gross normal ROM Cardiovascular: Normal rate and regular rhythm.   Pulmonary/Chest: Effort normal and breath sounds without rales or wheezing.  Abd:  Soft, NT, ND, + BS, no organomegaly Neurological: Pt is alert. At baseline orientation, motor grossly intact Skin: Skin is warm. No rashes, other new lesions, no LE edema Psychiatric: Pt behavior is normal without agitation  Right foot with tender swelling to the dorsal first and second rays, o/w neurovasc intact All otherwise neg per pt Lab Results  Component Value Date  WBC 11.1 (H) 08/26/2019   HGB 14.4 08/26/2019   HCT 44.2 08/26/2019   PLT 326 08/26/2019   GLUCOSE 80 08/26/2019   CHOL 148 08/26/2019   TRIG 218 (H) 08/26/2019   HDL 46 (L) 08/26/2019   LDLDIRECT 107.0 12/01/2018   LDLCALC 70 08/26/2019   ALT 17 08/26/2019   AST 13 08/26/2019   NA 137 08/26/2019   K 5.6 (H) 08/26/2019   CL 101 08/26/2019   CREATININE 0.68 08/26/2019   BUN 11 08/26/2019   CO2 31 08/26/2019   TSH 3.38 12/28/2019   HGBA1C 5.4 08/26/2019      Assessment & Plan:

## 2020-01-02 ENCOUNTER — Encounter: Payer: Self-pay | Admitting: Internal Medicine

## 2020-01-02 NOTE — Assessment & Plan Note (Signed)
stable overall by history and exam, recent data reviewed with pt, and pt to continue medical treatment as before,  to f/u any worsening symptoms or concerns  

## 2020-01-02 NOTE — Assessment & Plan Note (Addendum)
C/w tenosynovitis but for xray r/o fx, salon paz topical prn, volt gel prn, vicodin prn limited rx, and refer sports medicine  I spent 31 minutes in preparing to see the patient by review of recent labs, imaging and procedures, obtaining and reviewing separately obtained history, communicating with the patient and family or caregiver, ordering medications, tests or procedures, and documenting clinical information in the EHR including the differential Dx, treatment, and any further evaluation and other management of right foot pain, ADD, thyroid nodule, hyperglycemia

## 2020-01-02 NOTE — Assessment & Plan Note (Signed)
For f/u imaging,  to f/u any worsening symptoms or concerns

## 2020-01-03 NOTE — Progress Notes (Signed)
Subjective:    I'm seeing this patient as a consultation for Dr. Cathlean Cower. Note will be routed back to referring provider/PCP.  CC: R foot pain   I, Molly Weber, LAT, ATC, am serving as scribe for Dr. Lynne Leader.  HPI: Pt is a 49 y/o female presenting w/ c/o R foot pain x 1.5-2 months. Pt locates pain to R ant/dorsal foot at the MT heads and along her R lateral foot.   R foot swelling: yes Aggravating factors: driving; R foot eversion AROM Treatments tried: heat; ice; massage; Tylenol; Motrin; Naproxen; Tramadol; Hydrocodone  Dx imaging: 12/28/19 R foot XR  Past medical history, Surgical history, Family history, Social history, Allergies, and medications have been entered into the medical record, reviewed.   Review of Systems: No new headache, visual changes, nausea, vomiting, diarrhea, constipation, dizziness, abdominal pain, skin rash, fevers, chills, night sweats, weight loss, swollen lymph nodes, body aches, joint swelling, muscle aches, chest pain, shortness of breath, mood changes, visual or auditory hallucinations.   Objective:    Vitals:   01/04/20 1516  BP: 120/80  Pulse: 77  SpO2: 98%   General: Well Developed, well nourished, and in no acute distress.  Neuro/Psych: Alert and oriented x3, extra-ocular muscles intact, able to move all 4 extremities, sensation grossly intact. Skin: Warm and dry, no rashes noted.  Respiratory: Not using accessory muscles, speaking in full sentences, trachea midline.  Cardiovascular: Pulses palpable, no extremity edema. Abdomen: Does not appear distended. MSK: Right Foot: Slight swelling dorsal forefoot.  TTP dorsal forefoot and dorsal lateral forefoot.  There is a normal foot motion.  Pain with resisted foot eversion. Pulses cap refill and sensation intact.  Positive squeeze test metatarsals.  Lab and Radiology Results DG Foot Complete Right  Result Date: 12/28/2019 CLINICAL DATA:  Pain at first and second ray dorsally for 1  month with swelling, no known injury EXAM: RIGHT FOOT COMPLETE - 3+ VIEW COMPARISON:  None FINDINGS: Osseous mineralization normal. Joint spaces preserved. No acute fracture, dislocation, or bone destruction. IMPRESSION: No acute osseous abnormalities. Electronically Signed   By: Lavonia Dana M.D.   On: 12/28/2019 16:00   I, Lynne Leader, personally (independently) visualized and performed the interpretation of the images attached in this note.  Diagnostic Limited MSK Ultrasound of: Right forefoot Normal-appearing metatarsals without significant cortical defect or hypoechoic change.  Normal tendon structures.  No obvious Morton's neuroma. Impression: Normal foot   Impression and Recommendations:    Assessment and Plan: 49 y.o. female with persistent foot pain unclear etiology.  Pain ongoing for greater than 4 weeks at this point.  So far her x-ray and ultrasound are not very revealing.  Discussed options.  Plan for trial of exercises for peroneal longus tendinitis and Voltaren gel and metatarsal pads.  If not improving in a few more weeks proceed with MRI to further characterize cause of pain and rule out stress fracture and evaluate for tendinopathy or even Morton's neuroma.  PDMP not reviewed this encounter. Orders Placed This Encounter  Procedures  . [place] LIMITED JOINT SPACE STRUCTURES LOW RIGHT(NO LINKED CHARGES)    Order Specific Question:   Reason for Exam (SYMPTOM  OR DIAGNOSIS REQUIRED)    Answer:   R foot pain    Order Specific Question:   Preferred imaging location?    Answer:   [place]   No orders of the defined types were placed in this encounter.   Discussed warning signs or symptoms. Please see  discharge instructions. Patient expresses understanding.   The above documentation has been reviewed and is accurate and complete Lynne Leader, M.D.

## 2020-01-04 ENCOUNTER — Encounter: Payer: Self-pay | Admitting: Family Medicine

## 2020-01-04 ENCOUNTER — Ambulatory Visit: Payer: 59 | Admitting: Family Medicine

## 2020-01-04 ENCOUNTER — Ambulatory Visit: Payer: Self-pay

## 2020-01-04 ENCOUNTER — Other Ambulatory Visit: Payer: Self-pay

## 2020-01-04 VITALS — BP 120/80 | HR 77 | Ht 61.0 in | Wt 169.8 lb

## 2020-01-04 DIAGNOSIS — M79671 Pain in right foot: Secondary | ICD-10-CM | POA: Diagnosis not present

## 2020-01-04 NOTE — Patient Instructions (Addendum)
Thank you for coming in today.  Please use voltaren gel up to 4x daily for pain as needed.   Use the metatarsal pads.  Size medium metatarsal pads from [employer].   Get insoles with metatarsal pads built in. Look for metatarsalgia insoles.   Do the exercises Kayla reviewed. View at [email or web address]-[email or web address] using code: [id number]  If not improving in a few weeks let me know.  Next step is MRI.   I think the top thee diagnosis is  Peroneal Tendonitis Stress Fracture  Mortins Neuroma

## 2020-01-31 ENCOUNTER — Telehealth: Payer: Self-pay | Admitting: Internal Medicine

## 2020-01-31 MED ORDER — AMPHETAMINE-DEXTROAMPHETAMINE 10 MG PO TABS
10.0000 mg | ORAL_TABLET | Freq: Two times a day (BID) | ORAL | 0 refills | Status: DC
Start: 2020-01-31 — End: 2020-04-07

## 2020-01-31 NOTE — Telephone Encounter (Signed)
amphetamine-dextroamphetamine (ADDERALL) 10 MG tablet Last seen- 11.23.21 Next apt- n/a On med list  [id number] [place], [place] Phone:  [phone]  Fax:  [phone]     Requesting refill

## 2020-01-31 NOTE — Telephone Encounter (Signed)
Done erx 

## 2020-02-03 ENCOUNTER — Telehealth: Payer: Self-pay | Admitting: Internal Medicine

## 2020-02-03 MED ORDER — HYDROCODONE-ACETAMINOPHEN 5-325 MG PO TABS
1.0000 | ORAL_TABLET | Freq: Four times a day (QID) | ORAL | 0 refills | Status: DC | PRN
Start: 2020-02-03 — End: 2020-03-15

## 2020-02-03 NOTE — Telephone Encounter (Signed)
Done erx  [email or web address] reviewed

## 2020-02-03 NOTE — Telephone Encounter (Signed)
  1.Medication Requested: HYDROcodone-acetaminophen (NORCO/VICODIN) 5-325 MG tablet  2. Pharmacy (Name, Street, City):[phone] [place] [place] - [address] [facility] [address] [facility]  3. On Med List: yes  4. Last Visit with PCP: 12/28/19  5. Next visit date with PCP:   Agent: Please be advised that RX refills may take up to 3 business days. We ask that you follow-up with your pharmacy.

## 2020-02-03 NOTE — Addendum Note (Signed)
Addended by: Corwin Levins on: 02/03/2020 01:06 PM   Modules accepted: Orders

## 2020-02-29 ENCOUNTER — Other Ambulatory Visit: Payer: Self-pay | Admitting: Internal Medicine

## 2020-03-15 ENCOUNTER — Telehealth: Payer: Self-pay | Admitting: Internal Medicine

## 2020-03-15 MED ORDER — HYDROCODONE-ACETAMINOPHEN 5-325 MG PO TABS
1.0000 | ORAL_TABLET | Freq: Four times a day (QID) | ORAL | 0 refills | Status: DC | PRN
Start: 2020-03-15 — End: 2020-08-10

## 2020-03-15 NOTE — Telephone Encounter (Signed)
1.Medication Requested: HYDROcodone-acetaminophen (NORCO/VICODIN) 5-325 MG tablet    2. Pharmacy (Name, [place]): [id number] [place], [place]  3. On Med List: yes   4. Last Visit with PCP: 11.23.21  5. Next visit date with PCP: n/a   Patient said that her foot is still hurting.    Agent: Please be advised that RX refills may take up to 3 business days. We ask that you follow-up with your pharmacy.

## 2020-04-05 ENCOUNTER — Telehealth: Payer: Self-pay | Admitting: Internal Medicine

## 2020-04-05 NOTE — Telephone Encounter (Signed)
1.Medication Requested: amphetamine-dextroamphetamine (ADDERALL) 10 MG tablet  ALPRAZolam (XANAX) 0.5 MG tablet    2. Pharmacy (Name, [place]): [id number] [place], [place]  3. On Med List: yes   4. Last Visit with PCP: 11.23.21  5. Next visit date with PCP: n/a    Agent: Please be advised that RX refills may take up to 3 business days. We ask that you follow-up with your pharmacy.

## 2020-04-07 MED ORDER — AMPHETAMINE-DEXTROAMPHETAMINE 10 MG PO TABS: 10.0000 mg | ORAL_TABLET | Freq: Two times a day (BID) | ORAL | 0 refills | Status: DC

## 2020-04-07 NOTE — Telephone Encounter (Signed)
adderal done erx  No to xanax - too soon

## 2020-04-10 NOTE — Telephone Encounter (Signed)
Pt notified request is to soon

## 2020-04-14 ENCOUNTER — Telehealth (INDEPENDENT_AMBULATORY_CARE_PROVIDER_SITE_OTHER): Payer: 59 | Admitting: Family

## 2020-04-14 DIAGNOSIS — M545 Low back pain, unspecified: Secondary | ICD-10-CM | POA: Diagnosis not present

## 2020-04-14 DIAGNOSIS — J209 Acute bronchitis, unspecified: Secondary | ICD-10-CM

## 2020-04-14 MED ORDER — ALBUTEROL SULFATE HFA 108 (90 BASE) MCG/ACT IN AERS: 2.0000 | INHALATION_SPRAY | Freq: Four times a day (QID) | RESPIRATORY_TRACT | 0 refills | Status: DC | PRN

## 2020-04-14 MED ORDER — AZITHROMYCIN 250 MG PO TABS: ORAL_TABLET | ORAL | 0 refills | Status: DC

## 2020-04-14 MED ORDER — CYCLOBENZAPRINE HCL 10 MG PO TABS: 10.0000 mg | ORAL_TABLET | Freq: Three times a day (TID) | ORAL | 0 refills | Status: DC | PRN

## 2020-04-14 NOTE — Progress Notes (Signed)
Chelsea Ortiz is a 50 y.o. female with the following history as recorded in EpicCare:  Patient Active Problem List   Diagnosis Date Noted  . Right foot pain 12/28/2019  . Hyperglycemia 12/01/2018  . HLD (hyperlipidemia) 12/01/2018  . Carotid artery calcification, right 12/01/2018  . Thyroid nodule 12/01/2018  . Neck pain, acute 05/22/2018  . Weight gain 09/01/2017  . Nonallopathic lesion of thoracic region 02/25/2017  . Nonallopathic lesion of sacral region 02/25/2017  . Bilateral foot pain 11/20/2016  . Fatigue 11/20/2016  . Rash and nonspecific skin eruption 09/01/2015  . Umbilical abnormality 64/40/3474  . Obesity 08/25/2014  . Goiter 08/25/2014  . ADD (attention deficit disorder) 04/22/2011  . Anxiety 04/22/2011  . Scoliosis of thoracic spine 04/22/2011  . Allergic rhinitis 11/08/2010  . Preventative health care 11/05/2010    Current Outpatient Medications  Medication Sig Dispense Refill  . albuterol (VENTOLIN HFA) 108 (90 Base) MCG/ACT inhaler Inhale 2 puffs into the lungs every 6 (six) hours as needed for wheezing or shortness of breath. 8 g 0  . azithromycin (ZITHROMAX) 250 MG tablet 2 tabs po qd x 1 day; 1 tablet per day x 4 days; 6 tablet 0  . cyclobenzaprine (FLEXERIL) 10 MG tablet Take 1 tablet (10 mg total) by mouth 3 (three) times daily as needed for muscle spasms. 30 tablet 0  . ALPRAZolam (XANAX) 0.5 MG tablet TAKE 1 TABLET (0.5 MG TOTAL) BY MOUTH 2 (TWO) TIMES DAILY AS NEEDED. 60 tablet 2  . amphetamine-dextroamphetamine (ADDERALL) 10 MG tablet Take 1 tablet (10 mg total) by mouth 2 (two) times daily with a meal. 60 tablet 0  . HYDROcodone-acetaminophen (NORCO/VICODIN) 5-325 MG tablet Take 1 tablet by mouth every 6 (six) hours as needed. 30 tablet 0  . influenza vaccine (FLUCELVAX QUADRIVALENT) 0.5 ML injection Flucelvax Quad 2020-2021 (PF) 60 mcg (15 mcg x 4)/0.5 mL IM syringe    . ketorolac (ACULAR) 0.5 % ophthalmic solution Place 1 drop into the right eye 4  (four) times daily. 5 mL 0  . levothyroxine (SYNTHROID) 25 MCG tablet Take 1 tablet (25 mcg total) by mouth daily before breakfast. 90 tablet 3  . phentermine 37.5 MG capsule Take 1 capsule (37.5 mg total) by mouth every morning. 30 capsule 2   No current facility-administered medications for this visit.    Allergies: Penicillins  Past Medical History:  Diagnosis Date  . ADD (attention deficit disorder) 04/22/2011  . Allergic rhinitis, cause unspecified 11/08/2010  . Scoliosis of thoracic spine 04/22/2011  . Thyroid nodule 12/01/2018    Past Surgical History:  Procedure Laterality Date  . child birth  02/23/2003  . TONSILLECTOMY  1982    Family History  Problem Relation Age of Onset  . Heart disease Other   . Hypertension Other     Social History   Tobacco Use  . Smoking status: Current Every Day Smoker    Packs/day: 0.50    Types: Cigarettes  . Smokeless tobacco: Never Used  Substance Use Topics  . Alcohol use: No    Subjective:   I connected with Chelsea Ortiz on 04/14/20 at  3:20 PM EST by a video enabled telemedicine application and verified that I am speaking with the correct person using two identifiers.   I discussed the limitations of evaluation and management by telemedicine and the availability of in person appointments. The patient expressed understanding and agreed to proceed. Provider in office/ patient is at home; provider and patient are only 2  people on video call.   2-3 day history of cough/ congestion; prone to bronchitis and feels like symptoms are consistent with previous infections; has taken 2 COVID tests and both are negative; using OTC Dayquil, Nyquil and Mucinex; no chest pain or shortness of breath but notes that chest "feels tight."  Also feels that she has "thrown her back out" from coughing and requesting refill on Flexeril which she has taken in the past;   Objective:  There were no vitals filed for this visit.  General: Well developed, well  nourished, in no acute distress  Head: Normocephalic and atraumatic  Lungs: Respirations unlabored;  Neurologic: Alert and oriented; speech intact; face symmetrical;   Assessment:  1. Acute bronchitis, unspecified organism   2. Acute low back pain without sciatica, unspecified back pain laterality     Plan:  Negative home COVID tests; Rx for Z-pak #1 take as directed, Ventolin HFA 2 puffs bid; Rx for Flexeril 10 mg tid prn for muscle spasm; Follow-up if symptoms persist;   No follow-ups on file.  No orders of the defined types were placed in this encounter.   Requested Prescriptions   Signed Prescriptions Disp Refills  . azithromycin (ZITHROMAX) 250 MG tablet 6 tablet 0    Sig: 2 tabs po qd x 1 day; 1 tablet per day x 4 days;  . albuterol (VENTOLIN HFA) 108 (90 Base) MCG/ACT inhaler 8 g 0    Sig: Inhale 2 puffs into the lungs every 6 (six) hours as needed for wheezing or shortness of breath.  . cyclobenzaprine (FLEXERIL) 10 MG tablet 30 tablet 0    Sig: Take 1 tablet (10 mg total) by mouth 3 (three) times daily as needed for muscle spasms.

## 2020-04-16 ENCOUNTER — Telehealth: Payer: Self-pay | Admitting: Internal Medicine

## 2020-04-16 NOTE — Telephone Encounter (Signed)
error 

## 2020-06-01 ENCOUNTER — Telehealth: Payer: Self-pay | Admitting: Internal Medicine

## 2020-06-01 NOTE — Telephone Encounter (Signed)
OK to contact pt  Please remind pt I will no longer be able to prescribe the pain medication after July 1  Does the patient want to be referred to pain management to continue this? We are asking because it takes up to 90 days sometimes to complete the referral process

## 2020-06-02 NOTE — Telephone Encounter (Signed)
Patient does not want a referral

## 2020-08-01 ENCOUNTER — Telehealth: Payer: Self-pay | Admitting: Internal Medicine

## 2020-08-01 MED ORDER — ALPRAZOLAM 0.5 MG PO TABS: 0.5000 mg | ORAL_TABLET | Freq: Two times a day (BID) | ORAL | 2 refills | Status: DC | PRN

## 2020-08-01 NOTE — Telephone Encounter (Signed)
Ok done

## 2020-08-01 NOTE — Telephone Encounter (Signed)
1.Medication Requested: ALPRAZolam (XANAX) 0.5 MG tablet   2. Pharmacy (Name, [place]): [id number] [place], [place]  3. On Med List: yes   4. Last Visit with PCP: 12-28-19  5. Next visit date with PCP: n/a   Patient is requesting a 90 day supply. Please advise    Agent: Please be advised that RX refills may take up to 3 business days. We ask that you follow-up with your pharmacy.

## 2020-08-10 ENCOUNTER — Ambulatory Visit: Payer: 59 | Admitting: Internal Medicine

## 2020-08-10 ENCOUNTER — Encounter: Payer: Self-pay | Admitting: Internal Medicine

## 2020-08-10 ENCOUNTER — Other Ambulatory Visit: Payer: Self-pay

## 2020-08-10 VITALS — BP 118/78 | HR 67 | Temp 98.4°F | Ht 61.0 in | Wt 174.6 lb

## 2020-08-10 DIAGNOSIS — Z0001 Encounter for general adult medical examination with abnormal findings: Secondary | ICD-10-CM | POA: Diagnosis not present

## 2020-08-10 DIAGNOSIS — R221 Localized swelling, mass and lump, neck: Secondary | ICD-10-CM | POA: Diagnosis not present

## 2020-08-10 DIAGNOSIS — Z1159 Encounter for screening for other viral diseases: Secondary | ICD-10-CM | POA: Diagnosis not present

## 2020-08-10 DIAGNOSIS — F988 Other specified behavioral and emotional disorders with onset usually occurring in childhood and adolescence: Secondary | ICD-10-CM | POA: Diagnosis not present

## 2020-08-10 DIAGNOSIS — E559 Vitamin D deficiency, unspecified: Secondary | ICD-10-CM | POA: Diagnosis not present

## 2020-08-10 DIAGNOSIS — G4710 Hypersomnia, unspecified: Secondary | ICD-10-CM | POA: Diagnosis not present

## 2020-08-10 DIAGNOSIS — E785 Hyperlipidemia, unspecified: Secondary | ICD-10-CM | POA: Diagnosis not present

## 2020-08-10 DIAGNOSIS — R739 Hyperglycemia, unspecified: Secondary | ICD-10-CM | POA: Diagnosis not present

## 2020-08-10 DIAGNOSIS — E039 Hypothyroidism, unspecified: Secondary | ICD-10-CM | POA: Diagnosis not present

## 2020-08-10 DIAGNOSIS — E538 Deficiency of other specified B group vitamins: Secondary | ICD-10-CM

## 2020-08-10 LAB — HEPATIC FUNCTION PANEL
ALT: 20 U/L (ref 0–35)
AST: 17 U/L (ref 0–37)
Albumin: 4.2 g/dL (ref 3.5–5.2)
Alkaline Phosphatase: 46 U/L (ref 39–117)
Bilirubin, Direct: 0.1 mg/dL (ref 0.0–0.3)
Total Bilirubin: 0.4 mg/dL (ref 0.2–1.2)
Total Protein: 6.9 g/dL (ref 6.0–8.3)

## 2020-08-10 LAB — LIPID PANEL
Cholesterol: 162 mg/dL (ref 0–200)
HDL: 39.2 mg/dL (ref >39.00)
NonHDL: 122.61
Total CHOL/HDL Ratio: 4
Triglycerides: 210 mg/dL — ABNORMAL HIGH (ref 0.0–149.0)
VLDL: 42 mg/dL — ABNORMAL HIGH (ref 0.0–40.0)

## 2020-08-10 LAB — URINALYSIS, ROUTINE W REFLEX MICROSCOPIC
Bilirubin Urine: NEGATIVE
Ketones, ur: NEGATIVE
Leukocytes,Ua: NEGATIVE
Nitrite: NEGATIVE
Specific Gravity, Urine: 1.025 (ref 1.000–1.030)
Total Protein, Urine: NEGATIVE
Urine Glucose: NEGATIVE
Urobilinogen, UA: 0.2 (ref 0.0–1.0)
pH: 5.5 (ref 5.0–8.0)

## 2020-08-10 LAB — T4, FREE: Free T4: 0.6 ng/dL (ref 0.60–1.60)

## 2020-08-10 LAB — BASIC METABOLIC PANEL
BUN: 10 mg/dL (ref 6–23)
CO2: 28 mEq/L (ref 19–32)
Calcium: 9.1 mg/dL (ref 8.4–10.5)
Chloride: 105 mEq/L (ref 96–112)
Creatinine, Ser: 0.74 mg/dL (ref 0.40–1.20)
GFR: 95.42 mL/min (ref >60.00)
Glucose, Bld: 85 mg/dL (ref 70–99)
Potassium: 4.2 mEq/L (ref 3.5–5.1)
Sodium: 140 mEq/L (ref 135–145)

## 2020-08-10 LAB — CBC WITH DIFFERENTIAL/PLATELET
Basophils Absolute: 0 10*3/uL (ref 0.0–0.1)
Basophils Relative: 0.7 % (ref 0.0–3.0)
Eosinophils Absolute: 0 10*3/uL (ref 0.0–0.7)
Eosinophils Relative: 0.7 % (ref 0.0–5.0)
HCT: 40.4 % (ref 36.0–46.0)
Hemoglobin: 13.6 g/dL (ref 12.0–15.0)
Lymphocytes Relative: 25.6 % (ref 12.0–46.0)
Lymphs Abs: 1.7 10*3/uL (ref 0.7–4.0)
MCHC: 33.5 g/dL (ref 30.0–36.0)
MCV: 93.7 fl (ref 78.0–100.0)
Monocytes Absolute: 0.7 10*3/uL (ref 0.1–1.0)
Monocytes Relative: 10.5 % (ref 3.0–12.0)
Neutro Abs: 4.1 10*3/uL (ref 1.4–7.7)
Neutrophils Relative %: 62.5 % (ref 43.0–77.0)
Platelets: 265 10*3/uL (ref 150.0–400.0)
RBC: 4.32 Mil/uL (ref 3.87–5.11)
RDW: 14 % (ref 11.5–15.5)
WBC: 6.6 10*3/uL (ref 4.0–10.5)

## 2020-08-10 LAB — VITAMIN B12: Vitamin B-12: 502 pg/mL (ref 211–911)

## 2020-08-10 LAB — VITAMIN D 25 HYDROXY (VIT D DEFICIENCY, FRACTURES): VITD: 32.31 ng/mL (ref 30.00–100.00)

## 2020-08-10 LAB — TSH: TSH: 3.03 u[IU]/mL (ref 0.35–5.50)

## 2020-08-10 LAB — LDL CHOLESTEROL, DIRECT: Direct LDL: 98 mg/dL

## 2020-08-10 LAB — HEMOGLOBIN A1C: Hgb A1c MFr Bld: 5.7 % (ref 4.6–6.5)

## 2020-08-10 MED ORDER — AMPHETAMINE-DEXTROAMPHETAMINE 20 MG PO TABS: 20.0000 mg | ORAL_TABLET | Freq: Two times a day (BID) | ORAL | 0 refills | Status: DC

## 2020-08-10 MED ORDER — AZITHROMYCIN 250 MG PO TABS: ORAL_TABLET | ORAL | 0 refills | Status: AC

## 2020-08-10 NOTE — Assessment & Plan Note (Addendum)
Last vitamin D Lab Results  Component Value Date   VD25OH 17 (L) 08/26/2019   Low, to start oral replacement

## 2020-08-10 NOTE — Progress Notes (Signed)
Patient ID: Chelsea Ortiz, female   DOB: [birth year], 50 y.o.   MRN: [id number]         Chief Complaint:: wellness exam and Office Visit (Discuss thyroid)  , fatigue, hypersomnolence, ADD uncontrolled, low vit d, submandibular tender knots       HPI:  Chelsea Ortiz is a 50 y.o. female here for wellness ; declines colonoscopy for now, o/w up to date with preventive referral and immunizations                     Also with fatigue, feels blah, something not right and asking for thyroid testing.  Getting everything done is an efforts, seems to be mentally and emtionally all over the place, adderall not working as well as it used to.  Takng vit d 5000 u about every other day. Has several days of right submandibular tender knots, asking for ENT referral.  Also has hypersomnolence daytime and snoring at night associated with ongoing wt loss.  Also just feels :puffy all over:  Pt denies chest pain, increased sob or doe, wheezing, orthopnea, PND, increased LE swelling, palpitations, dizziness or syncope.   Pt denies polydipsia, polyuria, or new focal neuro s/s.   Pt denies fever, wt loss, night sweats, loss of appetite, or other constitutional symptoms  Wt Readings from Last 3 Encounters:  08/10/20 174 lb 9.6 oz (79.2 kg)  01/04/20 169 lb 12.8 oz (77 kg)  12/28/19 169 lb (76.7 kg)   BP Readings from Last 3 Encounters:  08/10/20 118/78  01/04/20 120/80  12/28/19 118/80   Immunization History  Administered Date(s) Administered   Influenza Inj Mdck Quad Pf 11/23/2018   Influenza,inj,Quad PF,6+ Mos 11/20/2016   Moderna Sars-Covid-2 Vaccination 04/16/2019, 05/17/2019, 04/27/2020   Tdap 11/08/2010   Health Maintenance Due  Topic Date Due   COLONOSCOPY (Pts 45-47yrs Insurance coverage will need to be confirmed)  Never done      Past Medical History:  Diagnosis Date   ADD (attention deficit disorder) 04/22/2011   Allergic rhinitis, cause unspecified 11/08/2010   Scoliosis of thoracic spine 04/22/2011    Thyroid nodule 12/01/2018   Past Surgical History:  Procedure Laterality Date   child birth  [birth year]/23/2003   TONSILLECTOMY  1982    reports that she quit smoking about 2 years ago. Her smoking use included cigarettes. She smoked an average of 0.50 packs per day. She has never used smokeless tobacco. She reports current drug use. She reports that she does not drink alcohol. family history includes Heart disease in an other family member; Hypertension in an other family member. Allergies  Allergen Reactions   Penicillins     hives   Current Outpatient Medications on File Prior to Visit  Medication Sig Dispense Refill   albuterol (VENTOLIN HFA) 108 (90 Base) MCG/ACT inhaler Inhale 2 puffs into the lungs every 6 (six) hours as needed for wheezing or shortness of breath. 8 g 0   ALPRAZolam (XANAX) 0.5 MG tablet Take 1 tablet (0.5 mg total) by mouth 2 (two) times daily as needed. 60 tablet 2   cyclobenzaprine (FLEXERIL) 10 MG tablet Take 1 tablet (10 mg total) by mouth 3 (three) times daily as needed for muscle spasms. 30 tablet 0   levothyroxine (SYNTHROID) 25 MCG tablet Take 1 tablet (25 mcg total) by mouth daily before breakfast. 90 tablet 3   No current facility-administered medications on file prior to visit.        ROS:  All  others reviewed and negative.  Objective        PE:  BP 118/78 (BP Location: Right Arm, Patient Position: Sitting, Cuff Size: Large)   Pulse 67   Temp 98.4 F (36.9 C) (Oral)   Ht 5\' 1"  (1.549 m)   Wt 174 lb 9.6 oz (79.2 kg)   SpO2 97%   BMI 32.99 kg/m                 Constitutional: Pt appears in NAD               HENT: Head: NCAT.                Right Ear: External ear normal.                 Left Ear: External ear normal.                Eyes: . Pupils are equal, round, and reactive to light. Conjunctivae and EOM are normal; has severa right submandbular tender knots               Nose: without d/c or deformity               Neck: Neck supple. Gross  normal ROM               Cardiovascular: Normal rate and regular rhythm.                 Pulmonary/Chest: Effort normal and breath sounds without rales or wheezing.                Abd:  Soft, NT, ND, + BS, no organomegaly               Neurological: Pt is alert. At baseline orientation, motor grossly intact               Skin: Skin is warm. No rashes, no other new lesions, LE edema - none               Psychiatric: Pt behavior is normal without agitation   Micro: none  Cardiac tracings I have personally interpreted today:  none  Pertinent Radiological findings (summarize): none   Lab Results  Component Value Date   WBC 6.6 08/10/2020   HGB 13.6 08/10/2020   HCT 40.4 08/10/2020   PLT 265.0 08/10/2020   GLUCOSE 85 08/10/2020   CHOL 162 08/10/2020   TRIG 210.0 (H) 08/10/2020   HDL 39.20 08/10/2020   LDLDIRECT 98.0 08/10/2020   LDLCALC 70 08/26/2019   ALT 20 08/10/2020   AST 17 08/10/2020   NA 140 08/10/2020   K 4.2 08/10/2020   CL 105 08/10/2020   CREATININE 0.74 08/10/2020   BUN 10 08/10/2020   CO2 28 08/10/2020   TSH 3.03 08/10/2020   HGBA1C 5.7 08/10/2020   Assessment/Plan:  Chelsea Ortiz is a 50 y.o. White or Caucasian [1] female with  has a past medical history of ADD (attention deficit disorder) (04/22/2011), Allergic rhinitis, cause unspecified (11/08/2010), Scoliosis of thoracic spine (04/22/2011), and Thyroid nodule (12/01/2018).  Vitamin D deficiency Last vitamin D Lab Results  Component Value Date   VD25OH 17 (L) 08/26/2019   Low, to start oral replacement   ADD (attention deficit disorder) Uncontrolled, to increase the adderall 20 bid  Encounter for well adult exam with abnormal findings Age and sex appropriate education and counseling updated with regular exercise and diet Referrals for preventative services -  declines colonoscopy Immunizations addressed - none needed Smoking counseling  - none needed Evidence for depression or other mood disorder -  ongoing anxiety Most recent labs reviewed. I have personally reviewed and have noted: 1) the patient's medical and social history 2) The patient's current medications and supplements 3) The patient's height, weight, and BMI have been recorded in the chart   HLD (hyperlipidemia) Lab Results  Component Value Date   LDLCALC 70 08/26/2019   Stable, pt to continue current lower chol diet   Hyperglycemia Lab Results  Component Value Date   HGBA1C 5.7 08/10/2020   Stable, pt to continue current medical treatment  - diet   Acquired hypothyroidism Lab Results  Component Value Date   TSH 3.03 08/10/2020   Stable, pt to continue levothyroxine   Hypersomnolence Uncontrolled, with mod to high suspicoin for OSA  - for pulmonary referral  Neck mass ? Dental related infection, has pcn allergy, for zpack, refer ENT per pt reqeust  Followup: Return in about 6 months (around 02/10/2021).  Cathlean Cower, MD 08/11/2020 9:22 PM [facility]

## 2020-08-10 NOTE — Patient Instructions (Signed)
Please take all new medication as prescribed - the antibiotic  Ok to increase the adderall to 20 mg twice per day  You will be contacted regarding the referral for: ENT, and pulmonary  Please continue all other medications as before, and refills have been done if requested.  Please have the pharmacy call with any other refills you may need.  Please continue your efforts at being more active, low cholesterol diet, and weight control.  You are otherwise up to date with prevention measures today.  Please keep your appointments with your specialists as you may have planned  Please go to the LAB at the blood drawing area for the tests to be done  You will be contacted by phone if any changes need to be made immediately.  Otherwise, you will receive a letter about your results with an explanation, but please check with MyChart first.  Please remember to sign up for MyChart if you have not done so, as this will be important to you in the future with finding out test results, communicating by private email, and scheduling acute appointments online when needed.  Please make an Appointment to return in 6 months, or sooner if needed

## 2020-08-11 ENCOUNTER — Encounter: Payer: Self-pay | Admitting: Internal Medicine

## 2020-08-11 LAB — HEPATITIS C ANTIBODY
Hepatitis C Ab: NONREACTIVE
SIGNAL TO CUT-OFF: 0.02 (ref <1.00)

## 2020-08-11 NOTE — Assessment & Plan Note (Signed)
Uncontrolled, with mod to high suspicoin for OSA  - for pulmonary referral

## 2020-08-11 NOTE — Assessment & Plan Note (Signed)
Lab Results  Component Value Date   LDLCALC 70 08/26/2019   Stable, pt to continue current lower chol diet

## 2020-08-11 NOTE — Assessment & Plan Note (Signed)
Uncontrolled, to increase the adderall 20 bid

## 2020-08-11 NOTE — Assessment & Plan Note (Signed)
?   Dental related infection, has pcn allergy, for zpack, refer ENT per pt reqeust

## 2020-08-11 NOTE — Assessment & Plan Note (Signed)
Lab Results  Component Value Date   HGBA1C 5.7 08/10/2020   Stable, pt to continue current medical treatment  - diet

## 2020-08-11 NOTE — Assessment & Plan Note (Signed)
Age and sex appropriate education and counseling updated with regular exercise and diet Referrals for preventative services - declines colonoscopy Immunizations addressed - none needed Smoking counseling  - none needed Evidence for depression or other mood disorder - ongoing anxiety Most recent labs reviewed. I have personally reviewed and have noted: 1) the patient's medical and social history 2) The patient's current medications and supplements 3) The patient's height, weight, and BMI have been recorded in the chart

## 2020-08-11 NOTE — Assessment & Plan Note (Signed)
Lab Results  Component Value Date   TSH 3.03 08/10/2020   Stable, pt to continue levothyroxine

## 2020-08-27 ENCOUNTER — Other Ambulatory Visit: Payer: Self-pay | Admitting: Internal Medicine

## 2020-08-30 ENCOUNTER — Other Ambulatory Visit: Payer: Self-pay | Admitting: Family

## 2020-09-25 ENCOUNTER — Ambulatory Visit (INDEPENDENT_AMBULATORY_CARE_PROVIDER_SITE_OTHER): Payer: 59 | Admitting: Otolaryngology

## 2020-11-01 ENCOUNTER — Telehealth: Payer: Self-pay

## 2020-11-01 MED ORDER — AMPHETAMINE-DEXTROAMPHETAMINE 20 MG PO TABS: 20.0000 mg | ORAL_TABLET | Freq: Two times a day (BID) | ORAL | 0 refills | Status: DC

## 2020-11-01 NOTE — Telephone Encounter (Signed)
1.Medication Requested: Adderall  2. Pharmacy (Name, [place], [place]): [facility]  3. On Med List: yes   4. Last Visit with PCP: 07/2020     Agent: Please be advised that RX refills may take up to 3 business days. We ask that you follow-up with your pharmacy.

## 2020-11-17 ENCOUNTER — Telehealth: Payer: Self-pay | Admitting: Internal Medicine

## 2020-11-17 MED ORDER — ALBUTEROL SULFATE HFA 108 (90 BASE) MCG/ACT IN AERS
INHALATION_SPRAY | RESPIRATORY_TRACT | 2 refills | Status: DC
Start: 2020-11-17 — End: 2020-11-17

## 2020-11-17 MED ORDER — ALBUTEROL SULFATE HFA 108 (90 BASE) MCG/ACT IN AERS: INHALATION_SPRAY | RESPIRATORY_TRACT | 2 refills | Status: DC

## 2020-11-17 NOTE — Telephone Encounter (Signed)
1.Medication Requested: albuterol (VENTOLIN HFA) 108 (90 Base) MCG/ACT inhaler  2. Pharmacy (Name, [place]): [id number] - [place], [place] - [place] Phone:  [phone]  Fax:  [phone]      4. Last Visit with PCP: 7.7.2022  5. Next visit date with PCP: Not scheduled   Advised that RX refills may take up to 3 business days. We ask that you follow-up with your pharmacy.

## 2020-11-17 NOTE — Addendum Note (Signed)
Addended by: Terence Lux A on: 11/17/2020 09:20 AM   Modules accepted: Orders

## 2020-12-18 ENCOUNTER — Ambulatory Visit: Payer: 59 | Admitting: Nurse Practitioner

## 2021-01-01 ENCOUNTER — Other Ambulatory Visit: Payer: Self-pay | Admitting: Internal Medicine

## 2021-03-18 ENCOUNTER — Other Ambulatory Visit: Payer: Self-pay | Admitting: Internal Medicine

## 2021-03-18 NOTE — Telephone Encounter (Signed)
Please refill as per office routine med refill policy (all routine meds to be refilled for 3 mo or monthly (per pt preference) up to one year from last visit, then month to month grace period for 3 mo, then further med refills will have to be denied) ? ?

## 2021-06-03 ENCOUNTER — Other Ambulatory Visit: Payer: Self-pay | Admitting: Internal Medicine

## 2021-06-07 ENCOUNTER — Other Ambulatory Visit: Payer: Self-pay | Admitting: Internal Medicine

## 2021-06-07 NOTE — Telephone Encounter (Signed)
Please refill as per office routine med refill policy (all routine meds to be refilled for 3 mo or monthly (per pt preference) up to one year from last visit, then month to month grace period for 3 mo, then further med refills will have to be denied) ? ?

## 2021-08-11 ENCOUNTER — Other Ambulatory Visit: Payer: Self-pay | Admitting: Internal Medicine

## 2021-08-12 NOTE — Telephone Encounter (Signed)
Please refill as per office routine med refill policy (all routine meds to be refilled for 3 mo or monthly (per pt preference) up to one year from last visit, then month to month grace period for 3 mo, then further med refills will have to be denied) ? ?

## 2021-08-20 ENCOUNTER — Encounter: Payer: 59 | Admitting: Internal Medicine

## 2021-08-23 ENCOUNTER — Other Ambulatory Visit: Payer: Self-pay

## 2021-08-23 ENCOUNTER — Emergency Department: Payer: 59

## 2021-08-23 ENCOUNTER — Emergency Department
Admission: EM | Admit: 2021-08-23 | Discharge: 2021-08-24 | Disposition: A | Discharge status: Home / Self Care | Payer: 59 | Attending: Emergency Medicine | Admitting: Emergency Medicine

## 2021-08-23 DIAGNOSIS — D72829 Elevated white blood cell count, unspecified: Secondary | ICD-10-CM | POA: Diagnosis not present

## 2021-08-23 DIAGNOSIS — H6502 Acute serous otitis media, left ear: Secondary | ICD-10-CM

## 2021-08-23 DIAGNOSIS — G43009 Migraine without aura, not intractable, without status migrainosus: Secondary | ICD-10-CM

## 2021-08-23 DIAGNOSIS — R519 Headache, unspecified: Secondary | ICD-10-CM | POA: Diagnosis present

## 2021-08-23 DIAGNOSIS — M542 Cervicalgia: Secondary | ICD-10-CM | POA: Insufficient documentation

## 2021-08-23 LAB — PROCALCITONIN: Procalcitonin: <0.1 ng/mL

## 2021-08-23 LAB — COMPREHENSIVE METABOLIC PANEL
ALT: 17 U/L (ref 0–44)
AST: 18 U/L (ref 15–41)
Albumin: 4.2 g/dL (ref 3.5–5.0)
Alkaline Phosphatase: 47 U/L (ref 38–126)
Anion gap: 8 (ref 5–15)
BUN: 18 mg/dL (ref 6–20)
CO2: 29 mmol/L (ref 22–32)
Calcium: 9 mg/dL (ref 8.9–10.3)
Chloride: 99 mmol/L (ref 98–111)
Creatinine, Ser: 0.74 mg/dL (ref 0.44–1.00)
GFR, Estimated: >60 mL/min (ref >60)
Glucose, Bld: 126 mg/dL — ABNORMAL HIGH (ref 70–99)
Potassium: 4.8 mmol/L (ref 3.5–5.1)
Sodium: 136 mmol/L (ref 135–145)
Total Bilirubin: 0.9 mg/dL (ref 0.3–1.2)
Total Protein: 7.4 g/dL (ref 6.5–8.1)

## 2021-08-23 LAB — CBC WITH DIFFERENTIAL/PLATELET
Abs Immature Granulocytes: 0.53 10*3/uL — ABNORMAL HIGH (ref 0.00–0.07)
Basophils Absolute: 0.1 10*3/uL (ref 0.0–0.1)
Basophils Relative: 1 %
Eosinophils Absolute: 0 10*3/uL (ref 0.0–0.5)
Eosinophils Relative: 0 %
HCT: 46.2 % — ABNORMAL HIGH (ref 36.0–46.0)
Hemoglobin: 15.1 g/dL — ABNORMAL HIGH (ref 12.0–15.0)
Immature Granulocytes: 3 %
Lymphocytes Relative: 20 %
Lymphs Abs: 3.8 10*3/uL (ref 0.7–4.0)
MCH: 30.6 pg (ref 26.0–34.0)
MCHC: 32.7 g/dL (ref 30.0–36.0)
MCV: 93.7 fL (ref 80.0–100.0)
Monocytes Absolute: 0.9 10*3/uL (ref 0.1–1.0)
Monocytes Relative: 5 %
Neutro Abs: 13.6 10*3/uL — ABNORMAL HIGH (ref 1.7–7.7)
Neutrophils Relative %: 71 %
Platelets: 315 10*3/uL (ref 150–400)
RBC: 4.93 MIL/uL (ref 3.87–5.11)
RDW: 14 % (ref 11.5–15.5)
WBC: 19 10*3/uL — ABNORMAL HIGH (ref 4.0–10.5)
nRBC: 0 % (ref 0.0–0.2)

## 2021-08-23 LAB — POC URINE PREG, ED: Preg Test, Ur: NEGATIVE

## 2021-08-23 MED ORDER — LACTATED RINGERS IV BOLUS
1000.0000 mL | Freq: Once | INTRAVENOUS | Status: AC
  Administered 2021-08-23: 1000 mL via INTRAVENOUS

## 2021-08-23 MED ORDER — PROCHLORPERAZINE EDISYLATE 10 MG/2ML IJ SOLN
10.0000 mg | Freq: Once | INTRAMUSCULAR | Status: AC
  Administered 2021-08-23: 10 mg via INTRAVENOUS
  Filled 2021-08-23: qty 2

## 2021-08-23 MED ORDER — IOHEXOL 300 MG/ML  SOLN
75.0000 mL | Freq: Once | INTRAMUSCULAR | Status: AC | PRN
  Administered 2021-08-23: 75 mL via INTRAVENOUS

## 2021-08-23 MED ORDER — MORPHINE SULFATE (PF) 4 MG/ML IV SOLN
4.0000 mg | Freq: Once | INTRAVENOUS | Status: AC
  Administered 2021-08-23: 4 mg via INTRAVENOUS
  Filled 2021-08-23: qty 1

## 2021-08-23 NOTE — ED Provider Notes (Signed)
[facility] Provider Note    Event Date/Time   First MD Initiated Contact with Patient 08/23/21 2232     (approximate)   History   Neck Pain, Facial Pain, and Headache   HPI  Chelsea Ortiz is a 51 y.o. female with a past history of thyroid nodule, scoliosis, allergic rhinitis and ADD as well as a recent diagnosis of right-sided facial dermatitis started on a prednisone taper and Benadryl about a week ago as well history of migraines who presents for evaluation of 2 to 3 days of fairly severe nontraumatic left-sided head and neck pain.  No prior similar episodes.  She states it does not feel like her typical migraine headaches.  She does states she has some discomfort in her ear.  No right-sided earache.  No sore throat.  No fevers, vision changes or nasal pain.  No symptoms below the shoulders including any cough, shortness of breath, chest pain, abdominal pain, nausea, vomiting or diarrhea.  She had some Toradol at clinic earlier today which she states did not help and was referred to emergency room for further evaluation.  She denies any tobacco use EtOH use illicit drugs.    Past Medical History:  Diagnosis Date   ADD (attention deficit disorder) 04/22/2011   Allergic rhinitis, cause unspecified 11/08/2010   Scoliosis of thoracic spine 04/22/2011   Thyroid nodule 12/01/2018     Physical Exam  Triage Vital Signs: ED Triage Vitals  Enc Vitals Group     BP 08/23/21 1922 (!) 156/96     Pulse Rate 08/23/21 1922 61     Resp 08/23/21 1922 17     Temp 08/23/21 1922 99.1 F (37.3 C)     Temp Source 08/23/21 1922 Oral     SpO2 08/23/21 1922 98 %     Weight 08/23/21 1923 176 lb (79.8 kg)     Height 08/23/21 1923 '5\' 1"'$  (1.549 m)     Head Circumference --      Peak Flow --      Pain Score 08/23/21 1923 10     Pain Loc --      Pain Edu? --      Excl. in [place]? --     Most recent vital signs: Vitals:   08/23/21 1922 08/23/21 2318  BP: (!) 156/96 138/87   Pulse: 61 69  Resp: 17 18  Temp: 99.1 F (37.3 C) 98.9 F (37.2 C)  SpO2: 98% 97%    General: Awake, appears uncomfortable. CV:  Good peripheral perfusion.  2+ radial pulse. Resp:  Normal effort.  Clear bilaterally. Abd:  No distention.  Soft throughout Other:  Cranial nerves II through XII grossly intact.  No pronator drift.  No finger dysmetria.  Symmetric 5/5 strength of all extremities.  Sensation intact to light touch in all extremities.  Unremarkable unassisted gait.  There appears to be a serous effusion in the left TM.  External canals unremarkable.  Patient is tender around her mastoid there is no large effusion or edema.  She states it hurts when she is moving her neck at the does not seem meningitic she says only hurts on the left side.  Oropharynx is unremarkable.   ED Results / Procedures / Treatments  Labs (all labs ordered are listed, but only abnormal results are displayed) Labs Reviewed  CBC WITH DIFFERENTIAL/PLATELET - Abnormal; Notable for the following components:      Result Value   WBC 19.0 (*)  Hemoglobin 15.1 (*)    HCT 46.2 (*)    Neutro Abs 13.6 (*)    Abs Immature Granulocytes 0.53 (*)    All other components within normal limits  COMPREHENSIVE METABOLIC PANEL - Abnormal; Notable for the following components:   Glucose, Bld 126 (*)    All other components within normal limits  CULTURE, BLOOD (ROUTINE X 2)  CULTURE, BLOOD (ROUTINE X 2)  PROCALCITONIN  LACTIC ACID, PLASMA  LACTIC ACID, PLASMA  POC URINE PREG, ED     EKG  EKG is remarkable for sinus rhythm with a ventricular rate of 62, normal axis, unremarkable intervals without clear evidence of acute ischemia or significant arrhythmia.   RADIOLOGY    PROCEDURES:  Critical Care performed: No  Procedures    MEDICATIONS ORDERED IN ED: Medications  iohexol (OMNIPAQUE) 300 MG/ML solution 75 mL (has no administration in time range)  prochlorperazine (COMPAZINE) injection 10 mg (10  mg Intravenous Given 08/23/21 2307)  lactated ringers bolus 1,000 mL (1,000 mLs Intravenous New Bag/Given 08/23/21 2307)  morphine (PF) 4 MG/ML injection 4 mg (4 mg Intravenous Given 08/23/21 2307)     IMPRESSION / MDM / ASSESSMENT AND PLAN / ED COURSE  I reviewed the triage vital signs and the nursing notes. Patient's presentation is most consistent with acute presentation with potential threat to life or bodily function.                               Differential diagnosis includes, but is not limited to otitis media, mastoiditis or other deep space infection.  EKG ordered in triage is unremarkable.  CMP without any significant electrolyte or metabolic derangements.  CBC shows WC count 19,000 hemoglobin 15.1 with normal platelets.  There is a left shift.  Some difficult to interpret in the setting of recent steroids.  Patient's temperature is 91.1.  Given concern for possible deeper space infection or abscess will obtain blood cultures and send a procalcitonin as well.  Procalcitonin is undetectable.  Care patient signed over to assuming provider approximately 2300 with plan to follow-up CT imaging of the head neck and mastoid area and that this is all reassuring patient can likely be safely discharged with antibiotics for an otitis media.      FINAL CLINICAL IMPRESSION(S) / ED DIAGNOSES   Final diagnoses:  Acute serous otitis media of left ear, recurrence not specified     Rx / DC Orders   ED Discharge Orders     None        Note:  This document was prepared using Dragon voice recognition software and may include unintentional dictation errors.   Lucrezia Starch, MD 08/23/21 [phone]

## 2021-08-23 NOTE — ED Notes (Signed)
First Nurse Note: Patient sent by [facility] for elevated WBC and headache. Patient give Toradol approx 15 minutes PTA to ED.

## 2021-08-24 LAB — LACTIC ACID, PLASMA
Lactic Acid, Venous: 1.8 mmol/L (ref 0.5–1.9)
Lactic Acid, Venous: 2.1 mmol/L (ref 0.5–1.9)

## 2021-08-24 MED ORDER — CEFDINIR 300 MG PO CAPS: 300.0000 mg | ORAL_CAPSULE | Freq: Two times a day (BID) | ORAL | 0 refills | Status: DC

## 2021-08-24 MED ORDER — ONDANSETRON 4 MG PO TBDP: 4.0000 mg | ORAL_TABLET | Freq: Four times a day (QID) | ORAL | 0 refills | Status: DC | PRN

## 2021-08-24 MED ORDER — IBUPROFEN 800 MG PO TABS: 800.0000 mg | ORAL_TABLET | Freq: Three times a day (TID) | ORAL | 0 refills | Status: DC | PRN

## 2021-08-24 MED ORDER — CEFDINIR 300 MG PO CAPS
300.0000 mg | ORAL_CAPSULE | Freq: Once | ORAL | Status: AC
  Administered 2021-08-24: 300 mg via ORAL
  Filled 2021-08-24: qty 1

## 2021-08-24 MED ORDER — HYDROCODONE-ACETAMINOPHEN 5-325 MG PO TABS: 1.0000 | ORAL_TABLET | ORAL | 0 refills | Status: DC | PRN

## 2021-08-24 NOTE — ED Provider Notes (Signed)
12:00 AM  Assumed care at signout.  Patient here with left-sided headache and neck pain.  Has an otitis media on exam.  CT scans pending for further evaluation.  Does have a leukocytosis but this may be from recent steroids that she was taking for a dermatitis.   1:10 AM  Pt reports feeling markedly improved with headache now down to 3/10.  CTs reviewed/interpreted by myself radiologist and show no acute abnormality.  Patient feels comfortable with plan for discharge home for antibiotics for otitis media.  We will also discharge with short course of analgesia.  Discussed return precautions.  Will discharge home.   At this time, I do not feel there is any life-threatening condition present. I reviewed all nursing notes, vitals, pertinent previous records.  All lab and urine results, EKGs, imaging ordered have been independently reviewed and interpreted by myself.  I reviewed all available radiology reports from any imaging ordered this visit.  Based on my assessment, I feel the patient is safe to be discharged home without further emergent workup and can continue workup as an outpatient as needed. Discussed all findings, treatment plan as well as usual and customary return precautions.  They verbalize understanding and are comfortable with this plan.  Outpatient follow-up has been provided as needed.  All questions have been answered.    Chelsea Ortiz, Delice Bison, DO 08/24/21 [phone]

## 2021-08-24 NOTE — Discharge Instructions (Addendum)

## 2021-08-28 ENCOUNTER — Other Ambulatory Visit: Payer: Self-pay | Admitting: Internal Medicine

## 2021-08-28 LAB — CULTURE, BLOOD (ROUTINE X 2)
Culture: NO GROWTH
Culture: NO GROWTH
Special Requests: ADEQUATE

## 2021-08-28 NOTE — Telephone Encounter (Signed)
Please refill as per office routine med refill policy (all routine meds to be refilled for 3 mo or monthly (per pt preference) up to one year from last visit, then month to month grace period for 3 mo, then further med refills will have to be denied) ? ?

## 2021-08-29 ENCOUNTER — Encounter: Payer: 59 | Admitting: Internal Medicine

## 2021-09-14 ENCOUNTER — Other Ambulatory Visit: Payer: Self-pay | Admitting: Internal Medicine

## 2021-09-14 NOTE — Telephone Encounter (Signed)
PT calls today in regards to getting the RX for their albuterol (VENTOLIN HFA) filled. Had let PT know that we had not received a request for this in the e-fax but it looks like they used e-script instead.

## 2021-09-14 NOTE — Telephone Encounter (Signed)
Pt is calling requesting that the refill: albuterol (VENTOLIN HFA) 108 (90 Base) MCG/ACT inhaler  Pharmacy: [id number]- [place], [place] - [address] Pt will be out of medication today.

## 2021-09-14 NOTE — Telephone Encounter (Signed)
Please refill as per office routine med refill policy (all routine meds to be refilled for 3 mo or monthly (per pt preference) up to one year from last visit, then month to month grace period for 3 mo, then further med refills will have to be denied) ? ?

## 2021-09-20 ENCOUNTER — Ambulatory Visit (INDEPENDENT_AMBULATORY_CARE_PROVIDER_SITE_OTHER): Payer: 59 | Admitting: Internal Medicine

## 2021-09-20 ENCOUNTER — Encounter: Payer: Self-pay | Admitting: Internal Medicine

## 2021-09-20 ENCOUNTER — Other Ambulatory Visit: Payer: Self-pay | Admitting: Internal Medicine

## 2021-09-20 VITALS — BP 118/78 | HR 62 | Temp 98.8°F | Ht 61.0 in | Wt 176.0 lb

## 2021-09-20 DIAGNOSIS — E785 Hyperlipidemia, unspecified: Secondary | ICD-10-CM

## 2021-09-20 DIAGNOSIS — Z0001 Encounter for general adult medical examination with abnormal findings: Secondary | ICD-10-CM | POA: Diagnosis not present

## 2021-09-20 DIAGNOSIS — E039 Hypothyroidism, unspecified: Secondary | ICD-10-CM

## 2021-09-20 DIAGNOSIS — E669 Obesity, unspecified: Secondary | ICD-10-CM

## 2021-09-20 DIAGNOSIS — Z1211 Encounter for screening for malignant neoplasm of colon: Secondary | ICD-10-CM

## 2021-09-20 DIAGNOSIS — R739 Hyperglycemia, unspecified: Secondary | ICD-10-CM

## 2021-09-20 DIAGNOSIS — E559 Vitamin D deficiency, unspecified: Secondary | ICD-10-CM | POA: Diagnosis not present

## 2021-09-20 DIAGNOSIS — E538 Deficiency of other specified B group vitamins: Secondary | ICD-10-CM

## 2021-09-20 LAB — BASIC METABOLIC PANEL
BUN: 12 mg/dL (ref 6–23)
CO2: 27 mEq/L (ref 19–32)
Calcium: 9.5 mg/dL (ref 8.4–10.5)
Chloride: 103 mEq/L (ref 96–112)
Creatinine, Ser: 0.74 mg/dL (ref 0.40–1.20)
GFR: 94.68 mL/min (ref >60.00)
Glucose, Bld: 87 mg/dL (ref 70–99)
Potassium: 4.7 mEq/L (ref 3.5–5.1)
Sodium: 137 mEq/L (ref 135–145)

## 2021-09-20 LAB — CBC WITH DIFFERENTIAL/PLATELET
Basophils Absolute: 0 10*3/uL (ref 0.0–0.1)
Basophils Relative: 0.6 % (ref 0.0–3.0)
Eosinophils Absolute: 0 10*3/uL (ref 0.0–0.7)
Eosinophils Relative: 0.6 % (ref 0.0–5.0)
HCT: 42.5 % (ref 36.0–46.0)
Hemoglobin: 14 g/dL (ref 12.0–15.0)
Lymphocytes Relative: 25.7 % (ref 12.0–46.0)
Lymphs Abs: 2 10*3/uL (ref 0.7–4.0)
MCHC: 33 g/dL (ref 30.0–36.0)
MCV: 94.4 fl (ref 78.0–100.0)
Monocytes Absolute: 1 10*3/uL (ref 0.1–1.0)
Monocytes Relative: 12.7 % — ABNORMAL HIGH (ref 3.0–12.0)
Neutro Abs: 4.6 10*3/uL (ref 1.4–7.7)
Neutrophils Relative %: 60.4 % (ref 43.0–77.0)
Platelets: 339 10*3/uL (ref 150.0–400.0)
RBC: 4.5 Mil/uL (ref 3.87–5.11)
RDW: 14.3 % (ref 11.5–15.5)
WBC: 7.6 10*3/uL (ref 4.0–10.5)

## 2021-09-20 LAB — LIPID PANEL
Cholesterol: 208 mg/dL — ABNORMAL HIGH (ref 0–200)
HDL: 40.9 mg/dL (ref >39.00)
Total CHOL/HDL Ratio: 5
Triglycerides: 509 mg/dL — ABNORMAL HIGH (ref 0.0–149.0)

## 2021-09-20 LAB — URINALYSIS, ROUTINE W REFLEX MICROSCOPIC
Bilirubin Urine: NEGATIVE
Ketones, ur: NEGATIVE
Leukocytes,Ua: NEGATIVE
Nitrite: NEGATIVE
Specific Gravity, Urine: >=1.03 — AB (ref 1.000–1.030)
Total Protein, Urine: NEGATIVE
Urine Glucose: NEGATIVE
Urobilinogen, UA: 0.2 (ref 0.0–1.0)
pH: 5.5 (ref 5.0–8.0)

## 2021-09-20 LAB — VITAMIN D 25 HYDROXY (VIT D DEFICIENCY, FRACTURES): VITD: 25.6 ng/mL — ABNORMAL LOW (ref 30.00–100.00)

## 2021-09-20 LAB — HEPATIC FUNCTION PANEL
ALT: 20 U/L (ref 0–35)
AST: 19 U/L (ref 0–37)
Albumin: 4.4 g/dL (ref 3.5–5.2)
Alkaline Phosphatase: 52 U/L (ref 39–117)
Bilirubin, Direct: 0.1 mg/dL (ref 0.0–0.3)
Total Bilirubin: 0.2 mg/dL (ref 0.2–1.2)
Total Protein: 7.2 g/dL (ref 6.0–8.3)

## 2021-09-20 LAB — VITAMIN B12: Vitamin B-12: 792 pg/mL (ref 211–911)

## 2021-09-20 LAB — TSH: TSH: 3.56 u[IU]/mL (ref 0.35–5.50)

## 2021-09-20 LAB — HEMOGLOBIN A1C: Hgb A1c MFr Bld: 5.8 % (ref 4.6–6.5)

## 2021-09-20 LAB — T4, FREE: Free T4: 0.6 ng/dL (ref 0.60–1.60)

## 2021-09-20 LAB — LDL CHOLESTEROL, DIRECT: Direct LDL: 123 mg/dL

## 2021-09-20 MED ORDER — AMPHETAMINE-DEXTROAMPHETAMINE 20 MG PO TABS: 20.0000 mg | ORAL_TABLET | Freq: Two times a day (BID) | ORAL | 0 refills | Status: DC

## 2021-09-20 MED ORDER — LEVOTHYROXINE SODIUM 25 MCG PO TABS: 25.0000 ug | ORAL_TABLET | Freq: Every day | ORAL | 2 refills | Status: DC

## 2021-09-20 MED ORDER — LEVOTHYROXINE SODIUM 25 MCG PO TABS: 25.0000 ug | ORAL_TABLET | Freq: Every day | ORAL | 3 refills | Status: DC

## 2021-09-20 MED ORDER — ALPRAZOLAM 0.5 MG PO TABS: 0.5000 mg | ORAL_TABLET | Freq: Two times a day (BID) | ORAL | 5 refills | Status: DC | PRN

## 2021-09-20 MED ORDER — TIRZEPATIDE 2.5 MG/0.5ML ~~LOC~~ SOAJ: 2.5000 mg | SUBCUTANEOUS | 11 refills | Status: DC

## 2021-09-20 MED ORDER — ALBUTEROL SULFATE HFA 108 (90 BASE) MCG/ACT IN AERS
INHALATION_SPRAY | RESPIRATORY_TRACT | 2 refills | Status: DC
Start: 2021-09-20 — End: 2022-01-06

## 2021-09-20 NOTE — Assessment & Plan Note (Signed)
Lab Results  Component Value Date   TSH 3.56 09/20/2021   Stable, pt to continue levothyroxine 25 qd

## 2021-09-20 NOTE — Assessment & Plan Note (Signed)
Age and sex appropriate education and counseling updated with regular exercise and diet Referrals for preventative services - for colonoscopy,  Immunizations addressed - declines covid booster, tdap Smoking counseling  - none needed Evidence for depression or other mood disorder - none significant Most recent labs reviewed. I have personally reviewed and have noted: 1) the patient's medical and social history 2) The patient's current medications and supplements 3) The patient's height, weight, and BMI have been recorded in the chart

## 2021-09-20 NOTE — Telephone Encounter (Signed)
[place] for PA for obesity

## 2021-09-20 NOTE — Assessment & Plan Note (Signed)
Lab Results  Component Value Date   HGBA1C 5.8 09/20/2021   Stable, pt to continue current medical treatment  - diet, wt control, excercise

## 2021-09-20 NOTE — Patient Instructions (Signed)
Please take all new medication as prescribed -- the mounjaro  Please continue all other medications as before, and refills have been done if requested.  Please have the pharmacy call with any other refills you may need.  Please continue your efforts at being more active, low cholesterol diet, and weight control.  You are otherwise up to date with prevention measures today.  Please keep your appointments with your specialists as you may have planned  Please go to the LAB at the blood drawing area for the tests to be done  You will be contacted by phone if any changes need to be made immediately.  Otherwise, you will receive a letter about your results with an explanation, but please check with MyChart first.  Please remember to sign up for MyChart if you have not done so, as this will be important to you in the future with finding out test results, communicating by private email, and scheduling acute appointments online when needed.  Please make an Appointment to return for your 1 year visit, or sooner if needed, with Lab testing by Appointment as well, to be done about 3-5 days before at the [facility] (so this is for TWO appointments - please see the scheduling desk as you leave)

## 2021-09-20 NOTE — Assessment & Plan Note (Signed)
Worsening uncontrolled , ok for mounjaro 2.5 mg subq wkly if ok with insurance

## 2021-09-20 NOTE — Assessment & Plan Note (Signed)
Lab Results  Component Value Date   LDLCALC 70 08/26/2019   Stable, pt to continue current low chol diet

## 2021-09-20 NOTE — Progress Notes (Signed)
Patient ID: Chelsea Ortiz, female   DOB: 01/09/1971, 51 y.o.   MRN: [id number]         Chief Complaint:: wellness exam and low thyroid, obesity, hld, low vit d       HPI:  Chelsea Ortiz is a 51 y.o. female here for wellness exam; due for colonoscopy, declines covid booster, tdap, o/w up to date                        Also Pt denies chest pain, increased sob or doe, wheezing, orthopnea, PND, increased LE swelling, palpitations, dizziness or syncope.   Pt denies polydipsia, polyuria, or new focal neuro s/s.    Pt denies fever, wt loss, night sweats, loss of appetite, or other constitutional symptoms  Hard to lose wt with diet and exercise.  Denies hyper or hypo thyroid symptoms such as voice, skin or hair change.   Wt Readings from Last 3 Encounters:  09/20/21 176 lb (79.8 kg)  08/23/21 176 lb (79.8 kg)  08/10/20 174 lb 9.6 oz (79.2 kg)   BP Readings from Last 3 Encounters:  09/20/21 118/78  08/24/21 128/84  08/10/20 118/78   Immunization History  Administered Date(s) Administered   Influenza Inj Mdck Quad Pf 11/23/2018   Influenza,inj,Quad PF,6+ Mos 11/20/2016   Moderna Sars-Covid-2 Vaccination 04/16/2019, 05/17/2019, 04/27/2020   Tdap 11/08/2010   Health Maintenance Due  Topic Date Due   COLONOSCOPY (Pts 45-71yr Insurance coverage will need to be confirmed)  Never done   INFLUENZA VACCINE  09/04/2021      Past Medical History:  Diagnosis Date   ADD (attention deficit disorder) 04/22/2011   Allergic rhinitis, cause unspecified 11/08/2010   Scoliosis of thoracic spine 04/22/2011   Thyroid nodule 12/01/2018   Past Surgical History:  Procedure Laterality Date   child birth  02/23/2002   TONSILLECTOMY  1982    reports that she quit smoking about 4 years ago. Her smoking use included cigarettes. She smoked an average of .5 packs per day. She has never used smokeless tobacco. She reports current drug use. She reports that she does not drink alcohol. family history includes Heart  disease in an other family member; Hypertension in an other family member. Allergies  Allergen Reactions   Penicillins     hives   Current Outpatient Medications on File Prior to Visit  Medication Sig Dispense Refill   influenza vaccine (FLUCELVAX - EGG FREE) 0.5 ML injection PHARMACY ADMINISTERED     No current facility-administered medications on file prior to visit.        ROS:  All others reviewed and negative.  Objective        PE:  BP 118/78 (BP Location: Left Arm, Patient Position: Sitting, Cuff Size: Large)   Pulse 62   Temp 98.8 F (37.1 C) (Oral)   Ht '5\' 1"'$  (1.549 m)   Wt 176 lb (79.8 kg)   SpO2 97%   BMI 33.25 kg/m                 Constitutional: Pt appears in NAD               HENT: Head: NCAT.                Right Ear: External ear normal.                 Left Ear: External ear normal.  Eyes: . Pupils are equal, round, and reactive to light. Conjunctivae and EOM are normal               Nose: without d/c or deformity               Neck: Neck supple. Gross normal ROM               Cardiovascular: Normal rate and regular rhythm.                 Pulmonary/Chest: Effort normal and breath sounds without rales or wheezing.                Abd:  Soft, NT, ND, + BS, no organomegaly               Neurological: Pt is alert. At baseline orientation, motor grossly intact               Skin: Skin is warm. No rashes, no other new lesions, LE edema - none               Psychiatric: Pt behavior is normal without agitation   Micro: none  Cardiac tracings I have personally interpreted today:  none  Pertinent Radiological findings (summarize): none   Lab Results  Component Value Date   WBC 7.6 09/20/2021   HGB 14.0 09/20/2021   HCT 42.5 09/20/2021   PLT 339.0 09/20/2021   GLUCOSE 87 09/20/2021   CHOL 208 (H) 09/20/2021   TRIG (H) 09/20/2021    509.0 Triglyceride is over 400; calculations on Lipids are invalid.   HDL 40.90 09/20/2021   LDLDIRECT 123.0  09/20/2021   LDLCALC 70 08/26/2019   ALT 20 09/20/2021   AST 19 09/20/2021   NA 137 09/20/2021   K 4.7 09/20/2021   CL 103 09/20/2021   CREATININE 0.74 09/20/2021   BUN 12 09/20/2021   CO2 27 09/20/2021   TSH 3.56 09/20/2021   HGBA1C 5.8 09/20/2021   Assessment/Plan:  Chelsea Ortiz is a 51 y.o. White or Caucasian [1] female with  has a past medical history of ADD (attention deficit disorder) (04/22/2011), Allergic rhinitis, cause unspecified (11/08/2010), Scoliosis of thoracic spine (04/22/2011), and Thyroid nodule (12/01/2018).  Encounter for well adult exam with abnormal findings Age and sex appropriate education and counseling updated with regular exercise and diet Referrals for preventative services - for colonoscopy,  Immunizations addressed - declines covid booster, tdap Smoking counseling  - none needed Evidence for depression or other mood disorder - none significant Most recent labs reviewed. I have personally reviewed and have noted: 1) the patient's medical and social history 2) The patient's current medications and supplements 3) The patient's height, weight, and BMI have been recorded in the chart   Hyperglycemia Lab Results  Component Value Date   HGBA1C 5.8 09/20/2021   Stable, pt to continue current medical treatment  - diet, wt control, excercise   HLD (hyperlipidemia) Lab Results  Component Value Date   LDLCALC 70 08/26/2019   Stable, pt to continue current low chol diet   Vitamin D deficiency Last vitamin D Lab Results  Component Value Date   VD25OH 25.60 (L) 09/20/2021   Low, to start oral replacement   Acquired hypothyroidism Lab Results  Component Value Date   TSH 3.56 09/20/2021   Stable, pt to continue levothyroxine 25 qd   Obesity Worsening uncontrolled , ok for mounjaro 2.5 mg subq wkly if ok with insurance  Followup: Return in  about 1 year (around 09/21/2022).  Cathlean Cower, MD 09/20/2021 9:40 PM [facility]

## 2021-09-20 NOTE — Assessment & Plan Note (Signed)
Last vitamin D Lab Results  Component Value Date   VD25OH 25.60 (L) 09/20/2021   Low, to start oral replacement

## 2021-09-25 ENCOUNTER — Telehealth: Payer: Self-pay

## 2021-09-25 NOTE — Telephone Encounter (Signed)
Prior auth started for this patient

## 2021-09-25 NOTE — Telephone Encounter (Signed)
PA started  Key: [id number]

## 2021-10-02 ENCOUNTER — Other Ambulatory Visit: Payer: Self-pay | Admitting: Internal Medicine

## 2021-11-15 ENCOUNTER — Ambulatory Visit (AMBULATORY_SURGERY_CENTER): Payer: 59

## 2021-11-15 VITALS — Ht 61.0 in | Wt 170.0 lb

## 2021-11-15 DIAGNOSIS — Z1211 Encounter for screening for malignant neoplasm of colon: Secondary | ICD-10-CM

## 2021-11-15 DIAGNOSIS — Z01818 Encounter for other preprocedural examination: Secondary | ICD-10-CM

## 2021-11-15 MED ORDER — NA SULFATE-K SULFATE-MG SULF 17.5-3.13-1.6 GM/177ML PO SOLN: 1.0000 | Freq: Once | ORAL | 0 refills | Status: AC

## 2021-11-15 NOTE — Progress Notes (Signed)
No egg or soy allergy known to patient   No issues known to pt with past sedation with any surgeries or procedures  Patient denies ever being told they had issues or difficulty with intubation   No FH of Malignant Hyperthermia  Pt is not on diet pills  Pt is not on  home 02   Pt is not on blood thinners   Pt denies issues with constipation   No A fib or A flutter  Have any cardiac testing pending--NO  Pt instructed to use Singlecare.com or GoodRx for a price reduction on prep   TELE previsit

## 2021-12-03 ENCOUNTER — Encounter: Payer: Self-pay | Admitting: Gastroenterology

## 2021-12-10 ENCOUNTER — Encounter: Payer: Self-pay | Admitting: Gastroenterology

## 2021-12-10 ENCOUNTER — Ambulatory Visit (AMBULATORY_SURGERY_CENTER): Payer: 59 | Admitting: Gastroenterology

## 2021-12-10 VITALS — BP 120/72 | HR 64 | Temp 98.0°F | Resp 21 | Ht 61.0 in | Wt 170.0 lb

## 2021-12-10 DIAGNOSIS — Z1211 Encounter for screening for malignant neoplasm of colon: Secondary | ICD-10-CM

## 2021-12-10 DIAGNOSIS — K5730 Diverticulosis of large intestine without perforation or abscess without bleeding: Secondary | ICD-10-CM

## 2021-12-10 DIAGNOSIS — D125 Benign neoplasm of sigmoid colon: Secondary | ICD-10-CM

## 2021-12-10 DIAGNOSIS — D122 Benign neoplasm of ascending colon: Secondary | ICD-10-CM

## 2021-12-10 MED ORDER — SODIUM CHLORIDE 0.9 % IV SOLN: 500.0000 mL | INTRAVENOUS | Status: DC

## 2021-12-10 NOTE — Progress Notes (Signed)
Called to room to assist during endoscopic procedure.  Patient ID and intended procedure confirmed with present staff. Received instructions for my participation in the procedure from the performing physician.  

## 2021-12-10 NOTE — Op Note (Signed)
[facility] Patient Name: Chelsea Ortiz Procedure Date: 12/10/2021 9:51 AM MRN: [id number] Endoscopist: Gerrit Heck , MD, [id number] Age: 51 Referring MD:  Date of Birth: [birth year] Gender: Female Account #: [id number] Procedure:                Colonoscopy Indications:              Screening for colorectal malignant neoplasm, This                            is the patient's first colonoscopy Medicines:                Monitored Anesthesia Care Procedure:                Pre-Anesthesia Assessment:                           - Prior to the procedure, a History and Physical                            was performed, and patient medications and                            allergies were reviewed. The patient's tolerance of                            previous anesthesia was also reviewed. The risks                            and benefits of the procedure and the sedation                            options and risks were discussed with the patient.                            All questions were answered, and informed consent                            was obtained. Prior Anticoagulants: The patient has                            taken no anticoagulant or antiplatelet agents. ASA                            Grade Assessment: II - A patient with mild systemic                            disease. After reviewing the risks and benefits,                            the patient was deemed in satisfactory condition to                            undergo the procedure.  After obtaining informed consent, the colonoscope                            was passed under direct vision. Throughout the                            procedure, the patient's blood pressure, pulse, and                            oxygen saturations were monitored continuously. The                            Olympus CF-HQ190L (Serial# [id number]) Colonoscope was                            introduced through the  anus and advanced to the the                            cecum, identified by appendiceal orifice and                            ileocecal valve. The colonoscopy was performed                            without difficulty. The patient tolerated the                            procedure well. The quality of the bowel                            preparation was excellent. The ileocecal valve,                            appendiceal orifice, and rectum were photographed. Scope In: 10:45:15 AM Scope Out: 10:56:11 AM Scope Withdrawal Time: 0 hours 8 minutes 32 seconds  Total Procedure Duration: 0 hours 10 minutes 56 seconds  Findings:                 The perianal and digital rectal examinations were                            normal.                           Three sessile polyps were found in the sigmoid                            colon (1) and ascending colon (2). The polyps were                            2 to 4 mm in size. These polyps were removed with a                            cold snare. Resection and retrieval were complete.  Estimated blood loss was minimal.                           A few small-mouthed diverticula were found in the                            sigmoid colon. The remainder of the colon was                            normal appearing.                           The retroflexed view of the distal rectum and anal                            verge was normal and showed no anal or rectal                            abnormalities. Complications:            No immediate complications. Estimated Blood Loss:     Estimated blood loss was minimal. Impression:               - Three 2 to 4 mm polyps in the sigmoid colon and                            in the ascending colon, removed with a cold snare.                            Resected and retrieved.                           - Diverticulosis in the sigmoid colon.                           - The distal rectum  and anal verge are normal on                            retroflexion view. Recommendation:           - Patient has a contact number available for                            emergencies. The signs and symptoms of potential                            delayed complications were discussed with the                            patient. Return to normal activities tomorrow.                            Written discharge instructions were provided to the  patient.                           - Resume previous diet.                           - Continue present medications.                           - Await pathology results.                           - Repeat colonoscopy for surveillance based on                            pathology results.                           - Return to GI office PRN. Gerrit Heck, MD 12/10/2021 11:01:37 AM

## 2021-12-10 NOTE — Progress Notes (Signed)
Pt resting comfortably. VSS. Airway intact. SBAR complete to RN. All questions answered.   

## 2021-12-10 NOTE — Patient Instructions (Signed)
Handout on polyps and diverticulosis given to patient.  Await pathology results.  Resume previous diet and continue present medications.  Repeat colonoscopy for surveillance will be determined based off of pathology results.   YOU HAD AN ENDOSCOPIC PROCEDURE TODAY AT THE [facility]:   Refer to the procedure report that was given to you for any specific questions about what was found during the examination.  If the procedure report does not answer your questions, please call your gastroenterologist to clarify.  If you requested that your care partner not be given the details of your procedure findings, then the procedure report has been included in a sealed envelope for you to review at your convenience later.  YOU SHOULD EXPECT: Some feelings of bloating in the abdomen. Passage of more gas than usual.  Walking can help get rid of the air that was put into your GI tract during the procedure and reduce the bloating. If you had a lower endoscopy (such as a colonoscopy or flexible sigmoidoscopy) you may notice spotting of blood in your stool or on the toilet paper. If you underwent a bowel prep for your procedure, you may not have a normal bowel movement for a few days.  Please Note:  You might notice some irritation and congestion in your nose or some drainage.  This is from the oxygen used during your procedure.  There is no need for concern and it should clear up in a day or so.  SYMPTOMS TO REPORT IMMEDIATELY:  Following lower endoscopy (colonoscopy or flexible sigmoidoscopy):  Excessive amounts of blood in the stool  Significant tenderness or worsening of abdominal pains  Swelling of the abdomen that is new, acute  Fever of 100F or higher  For urgent or emergent issues, a gastroenterologist can be reached at any hour by calling [phone]. Do not use MyChart messaging for urgent concerns.    DIET:  We do recommend a small meal at first, but then you may proceed to your  regular diet.  Drink plenty of fluids but you should avoid alcoholic beverages for 24 hours.  ACTIVITY:  You should plan to take it easy for the rest of today and you should NOT DRIVE or use heavy machinery until tomorrow (because of the sedation medicines used during the test).    FOLLOW UP: Our staff will call the number listed on your records the next business day following your procedure.  We will call around 7:15- 8:00 am to check on you and address any questions or concerns that you may have regarding the information given to you following your procedure. If we do not reach you, we will leave a message.     If any biopsies were taken you will be contacted by phone or by letter within the next 1-3 weeks.  Please call us at [phone] if you have not heard about the biopsies in 3 weeks.    SIGNATURES/CONFIDENTIALITY: You and/or your care partner have signed paperwork which will be entered into your electronic medical record.  These signatures attest to the fact that that the information above on your After Visit Summary has been reviewed and is understood.  Full responsibility of the confidentiality of this discharge information lies with you and/or your care-partner. 

## 2021-12-10 NOTE — Progress Notes (Signed)
GASTROENTEROLOGY PROCEDURE H&P NOTE   Primary Care Physician: Biagio Borg, MD    Reason for Procedure:  Colon Cancer screening  Plan:    Colonoscopy  Patient is appropriate for endoscopic procedure(s) in the ambulatory ([facility]) setting.  The nature of the procedure, as well as the risks, benefits, and alternatives were carefully and thoroughly reviewed with the patient. Ample time for discussion and questions allowed. The patient understood, was satisfied, and agreed to proceed.     HPI: Chelsea Ortiz is a 51 y.o. female who presents for colonoscopy for routine Colon Cancer screening.  No active GI symptoms.  No known family history of colon cancer or related malignancy.  Patient is otherwise without complaints or active issues today.  Past Medical History:  Diagnosis Date   ADD (attention deficit disorder) 04/22/2011   Allergic rhinitis, cause unspecified 11/08/2010   Allergy    seasonal   Scoliosis of thoracic spine 04/22/2011   Thyroid nodule 12/01/2018    Past Surgical History:  Procedure Laterality Date   child birth  02/23/2002   TONSILLECTOMY  1982    Prior to Admission medications   Medication Sig Start Date End Date Taking? Authorizing Provider  albuterol (VENTOLIN HFA) 108 (90 Base) MCG/ACT inhaler 2 puffs inhaled four times per day as needed 09/20/21   Biagio Borg, MD  ALPRAZolam Duanne Moron) 0.5 MG tablet Take 1 tablet (0.5 mg total) by mouth 2 (two) times daily as needed. 09/20/21   Biagio Borg, MD  amphetamine-dextroamphetamine (ADDERALL) 20 MG tablet Take 1 tablet (20 mg total) by mouth 2 (two) times daily. 09/20/21   Biagio Borg, MD  influenza vaccine (FLUCELVAX - EGG FREE) 0.5 ML injection PHARMACY ADMINISTERED    [provider]  levonorgestrel (MIRENA, 52 MG,) 20 MCG/DAY IUD Take 1 device by intrauterine route. 06/16/19   [provider]  levothyroxine (SYNTHROID) 25 MCG tablet Take 1 tablet (25 mcg total) by mouth daily before  breakfast. Overdue for Annual appt must see MD for renewals 09/20/21   Biagio Borg, MD  [facility] 2.5 MG/0.5ML Pen INJECT 2.5 MG SUBCUTANEOUSLY WEEKLY 10/02/21   Biagio Borg, MD  VITAMIN D, ERGOCALCIFEROL, PO Take by mouth once a week. 10000 IU    [provider]    Current Outpatient Medications  Medication Sig Dispense Refill   albuterol (VENTOLIN HFA) 108 (90 Base) MCG/ACT inhaler 2 puffs inhaled four times per day as needed 8.5 each 2   ALPRAZolam (XANAX) 0.5 MG tablet Take 1 tablet (0.5 mg total) by mouth 2 (two) times daily as needed. 60 tablet 5   amphetamine-dextroamphetamine (ADDERALL) 20 MG tablet Take 1 tablet (20 mg total) by mouth 2 (two) times daily. 60 tablet 0   influenza vaccine (FLUCELVAX - EGG FREE) 0.5 ML injection PHARMACY ADMINISTERED     levonorgestrel (MIRENA, 52 MG,) 20 MCG/DAY IUD Take 1 device by intrauterine route.     levothyroxine (SYNTHROID) 25 MCG tablet Take 1 tablet (25 mcg total) by mouth daily before breakfast. Overdue for Annual appt must see MD for renewals 90 tablet 3   MOUNJARO 2.5 MG/0.5ML Pen INJECT 2.5 MG SUBCUTANEOUSLY WEEKLY 2 mL 11   VITAMIN D, ERGOCALCIFEROL, PO Take by mouth once a week. 10000 IU     Current Facility-Administered Medications  Medication Dose Route Frequency Provider Last Rate Last Admin   0.9 %  sodium chloride infusion  500 mL Intravenous Continuous Josephanthony Tindel V, DO  Allergies as of 12/10/2021 - Review Complete 12/10/2021  Allergen Reaction Noted   Other Hives and Other (See Comments) 11/15/2021   Penicillins  11/08/2010    Family History  Problem Relation Age of Onset   Heart disease Other    Hypertension Other    Colon cancer Neg Hx    Colon polyps Neg Hx    Esophageal cancer Neg Hx    Rectal cancer Neg Hx    Stomach cancer Neg Hx     Social History   Socioeconomic History   Marital status: Married    Spouse name: Not on file   Number of children: Not on file   Years of education: 14    Highest education level: Not on file  Occupational History   Occupation: rental cord.  Tobacco Use   Smoking status: Former    Packs/day: 0.50    Years: 28.00    Total pack years: 14.00    Types: Cigarettes    Quit date: 09/04/2017    Years since quitting: 4.2   Smokeless tobacco: Never  Vaping Use   Vaping Use: Never used  Substance and Sexual Activity   Alcohol use: Never   Drug use: Never   Sexual activity: Not on file  Other Topics Concern   Not on file  Social History Narrative   Not on file   Social Determinants of Health   Financial Resource Strain: Not on file  Food Insecurity: Not on file  Transportation Needs: Not on file  Physical Activity: Not on file  Stress: Not on file  Social Connections: Not on file  Intimate Partner Violence: Not on file    Physical Exam: Vital signs in last 24 hours: '@BP'$  120/76   Pulse 61   Temp 98 F (36.7 C)   Ht '5\' 1"'$  (1.549 m)   Wt 170 lb (77.1 kg)   SpO2 98%   BMI 32.12 kg/m  GEN: NAD EYE: Sclerae anicteric ENT: MMM CV: Non-tachycardic Pulm: CTA b/l GI: Soft, NT/ND NEURO:  Alert & Oriented x Pender, DO [facility]   12/10/2021 9:55 AM

## 2021-12-11 ENCOUNTER — Telehealth: Payer: Self-pay

## 2021-12-11 NOTE — Telephone Encounter (Signed)
Left message on follow up call. 

## 2021-12-19 ENCOUNTER — Encounter: Payer: Self-pay | Admitting: Gastroenterology

## 2022-01-06 ENCOUNTER — Other Ambulatory Visit: Payer: Self-pay | Admitting: Internal Medicine

## 2022-03-05 ENCOUNTER — Encounter: Payer: Self-pay | Admitting: Internal Medicine

## 2022-03-05 ENCOUNTER — Telehealth (INDEPENDENT_AMBULATORY_CARE_PROVIDER_SITE_OTHER): Payer: 59 | Admitting: Internal Medicine

## 2022-03-05 DIAGNOSIS — U071 COVID-19: Secondary | ICD-10-CM

## 2022-03-05 MED ORDER — PROMETHAZINE-DM 6.25-15 MG/5ML PO SYRP: 5.0000 mL | ORAL_SOLUTION | Freq: Four times a day (QID) | ORAL | 0 refills | Status: DC | PRN

## 2022-03-05 MED ORDER — NIRMATRELVIR/RITONAVIR (PAXLOVID)TABLET: 3.0000 | ORAL_TABLET | Freq: Two times a day (BID) | ORAL | 0 refills | Status: AC

## 2022-03-05 NOTE — Progress Notes (Signed)
Virtual Visit via Video Note  I connected with Chelsea Ortiz on 03/05/22 at  3:40 PM EST by a video enabled telemedicine application and verified that I am speaking with the correct person using two identifiers.   I discussed the limitations of evaluation and management by telemedicine and the availability of in person appointments. The patient expressed understanding and agreed to proceed.  Present for the visit:  Myself, Dr Billey Gosling, Humphrey Rolls.  The patient is currently at home and I am in the office.    No referring provider.    History of Present Illness: She is here for an acute visit.   Her husband has COVID and her symptoms started yesterday.  She did test herself and she was positive.  She states fevers, chills, fatigue, body aches, change in taste and smell, congestion, sinus pain, sore throat, minimal cough and wheeze.   Review of Systems  Constitutional:  Positive for chills, fever and malaise/fatigue.  HENT:  Positive for congestion, sinus pain and sore throat.        Decreased taste and smell  Respiratory:  Positive for cough (minimal) and wheezing. Negative for shortness of breath.   Musculoskeletal:  Positive for myalgias.  Neurological:  Negative for headaches.      Social History   Socioeconomic History   Marital status: Married    Spouse name: Not on file   Number of children: Not on file   Years of education: 14   Highest education level: Not on file  Occupational History   Occupation: rental cord.  Tobacco Use   Smoking status: Former    Packs/day: 0.50    Years: 28.00    Total pack years: 14.00    Types: Cigarettes    Quit date: 09/04/2017    Years since quitting: 4.5   Smokeless tobacco: Never  Vaping Use   Vaping Use: Never used  Substance and Sexual Activity   Alcohol use: Never   Drug use: Never   Sexual activity: Not on file  Other Topics Concern   Not on file  Social History Narrative   Not on file   Social Determinants of  Health   Financial Resource Strain: Not on file  Food Insecurity: Not on file  Transportation Needs: Not on file  Physical Activity: Not on file  Stress: Not on file  Social Connections: Not on file     Observations/Objective: Appears mildly ill in NAD Sounds congested Breathing normally  Assessment and Plan:  COVID: Acute Symptoms started yesterday and tested positive yesterday Today is day 2 of symptoms Symptoms mild-moderate in nature She is interested in treatment to help her improve her symptoms Kidney function normal-start Paxlovid 3 tabs twice daily x 5 days Does not take alprazolam on a regular basis and will hold while taking Paxlovid Promethazine-dextra more: Cough syrup sent to pharmacy Discussed over-the-counter medication she can take for symptom relief Rest, fluids She will call with any questions or concerns   Follow Up Instructions:    I discussed the assessment and treatment plan with the patient. The patient was provided an opportunity to ask questions and all were answered. The patient agreed with the plan and demonstrated an understanding of the instructions.   The patient was advised to call back or seek an in-person evaluation if the symptoms worsen or if the condition fails to improve as anticipated.    Binnie Rail, MD

## 2022-03-11 ENCOUNTER — Ambulatory Visit: Payer: 59 | Admitting: Family Medicine

## 2022-03-11 ENCOUNTER — Other Ambulatory Visit: Payer: Self-pay | Admitting: *Deleted

## 2022-03-11 ENCOUNTER — Encounter: Payer: Self-pay | Admitting: Family Medicine

## 2022-03-11 VITALS — BP 108/68 | HR 66 | Temp 98.6°F | Resp 20 | Ht 61.0 in | Wt 167.0 lb

## 2022-03-11 DIAGNOSIS — J0140 Acute pansinusitis, unspecified: Secondary | ICD-10-CM | POA: Diagnosis not present

## 2022-03-11 MED ORDER — DOXYCYCLINE HYCLATE 100 MG PO TABS: 100.0000 mg | ORAL_TABLET | Freq: Two times a day (BID) | ORAL | 0 refills | Status: AC

## 2022-03-11 MED ORDER — ALBUTEROL SULFATE HFA 108 (90 BASE) MCG/ACT IN AERS: INHALATION_SPRAY | RESPIRATORY_TRACT | 2 refills | Status: DC

## 2022-03-11 NOTE — Patient Instructions (Signed)
Dayquil and Nyquil while battling these symptoms.

## 2022-03-11 NOTE — Progress Notes (Addendum)
Assessment & Plan:  1. Acute non-recurrent pansinusitis Education provided on sinus infection.  Encouraged adequate hydration.  Recommended DayQuil and NyQuil to relieve her symptoms. - doxycycline (VIBRA-TABS) 100 MG tablet; Take 1 tablet (100 mg total) by mouth 2 (two) times daily for 7 days.  Dispense: 14 tablet; Refill: 0    Follow up plan: Return if symptoms worsen or fail to improve.  Hendricks Limes, MSN, APRN, FNP-C  Subjective:  HPI: Chelsea Ortiz is a 52 y.o. female presenting on 03/11/2022 for Sinusitis (Facial pressure and pain, teeth hurt, slight cough: pos covid last week, finished med)  Patient complains of cough and facial pain/pressure.  She reports she has been unable to sleep for the past two days due to the pain in her face.  Onset of symptoms was a few days ago.  She tested positive for COVID one week ago and was prescribed Paxlovid.  She reports her symptoms completely resolved.  She is drinking plenty of fluids. Treatment to date: cough suppressants, decongestants, and Paxlovid . She does not smoke.     ROS: Negative unless specifically indicated above in HPI.   Relevant past medical history reviewed and updated as indicated.   Allergies and medications reviewed and updated.   Current Outpatient Medications:    albuterol (VENTOLIN HFA) 108 (90 Base) MCG/ACT inhaler, INHALE 2 PUFFS INTO LUNGS 4 TIMES A DAY AS NEEDED, Disp: 8.5 each, Rfl: 2   ALPRAZolam (XANAX) 0.5 MG tablet, Take 1 tablet (0.5 mg total) by mouth 2 (two) times daily as needed., Disp: 60 tablet, Rfl: 5   amphetamine-dextroamphetamine (ADDERALL) 20 MG tablet, Take 1 tablet (20 mg total) by mouth 2 (two) times daily., Disp: 60 tablet, Rfl: 0   doxycycline (VIBRA-TABS) 100 MG tablet, Take 1 tablet (100 mg total) by mouth 2 (two) times daily for 7 days., Disp: 14 tablet, Rfl: 0   levonorgestrel (MIRENA, 52 MG,) 20 MCG/DAY IUD, Take 1 device by intrauterine route., Disp: , Rfl:    levothyroxine  (SYNTHROID) 25 MCG tablet, Take 1 tablet (25 mcg total) by mouth daily before breakfast. Overdue for Annual appt must see MD for renewals, Disp: 90 tablet, Rfl: 3   promethazine-dextromethorphan (PROMETHAZINE-DM) 6.25-15 MG/5ML syrup, Take 5 mLs by mouth 4 (four) times daily as needed for cough., Disp: 150 mL, Rfl: 0   VITAMIN D, ERGOCALCIFEROL, PO, Take by mouth once a week. 10000 IU, Disp: , Rfl:   Allergies  Allergen Reactions   Other Hives and Other (See Comments)   Penicillins     hives    Objective:   BP 108/68   Pulse 66   Temp 98.6 F (37 C)   Resp 20   Ht '5\' 1"'$  (1.549 m)   Wt 167 lb (75.8 kg)   BMI 31.55 kg/m    Physical Exam Vitals reviewed.  Constitutional:      General: She is not in acute distress.    Appearance: Normal appearance. She is not ill-appearing, toxic-appearing or diaphoretic.  HENT:     Head: Normocephalic and atraumatic.     Right Ear: Ear canal and external ear normal. No middle ear effusion. There is no impacted cerumen. Tympanic membrane is erythematous. Tympanic membrane is not scarred, perforated, retracted or bulging.     Left Ear: Tympanic membrane, ear canal and external ear normal. There is no impacted cerumen.     Nose: Congestion present. No rhinorrhea.     Right Sinus: Maxillary sinus tenderness and frontal sinus tenderness  present.     Left Sinus: Maxillary sinus tenderness and frontal sinus tenderness present.     Mouth/Throat:     Mouth: Mucous membranes are moist.     Pharynx: Oropharynx is clear. No oropharyngeal exudate or posterior oropharyngeal erythema.  Eyes:     General: No scleral icterus.       Right eye: No discharge.        Left eye: No discharge.     Conjunctiva/sclera: Conjunctivae normal.  Cardiovascular:     Rate and Rhythm: Normal rate and regular rhythm.     Heart sounds: Normal heart sounds. No murmur heard.    No friction rub. No gallop.  Pulmonary:     Effort: Pulmonary effort is normal. No respiratory  distress.     Breath sounds: Normal breath sounds. No stridor. No wheezing, rhonchi or rales.  Musculoskeletal:        General: Normal range of motion.     Cervical back: Normal range of motion.  Lymphadenopathy:     Cervical: Cervical adenopathy present.  Skin:    General: Skin is warm and dry.     Capillary Refill: Capillary refill takes less than 2 seconds.  Neurological:     General: No focal deficit present.     Mental Status: She is alert and oriented to person, place, and time. Mental status is at baseline.  Psychiatric:        Mood and Affect: Mood normal.        Behavior: Behavior normal.        Thought Content: Thought content normal.        Judgment: Judgment normal.

## 2022-03-15 ENCOUNTER — Other Ambulatory Visit: Payer: Self-pay | Admitting: Internal Medicine

## 2022-03-15 NOTE — Telephone Encounter (Signed)
Please refill as per office routine med refill policy (all routine meds to be refilled for 3 mo or monthly (per pt preference) up to one year from last visit, then month to month grace period for 3 mo, then further med refills will have to be denied) ? ?

## 2022-05-08 ENCOUNTER — Other Ambulatory Visit: Payer: Self-pay | Admitting: Internal Medicine

## 2022-06-01 ENCOUNTER — Other Ambulatory Visit: Payer: Self-pay | Admitting: Internal Medicine

## 2022-09-04 ENCOUNTER — Encounter (INDEPENDENT_AMBULATORY_CARE_PROVIDER_SITE_OTHER): Payer: Self-pay

## 2022-09-23 ENCOUNTER — Ambulatory Visit (INDEPENDENT_AMBULATORY_CARE_PROVIDER_SITE_OTHER): Payer: 59 | Admitting: Internal Medicine

## 2022-09-23 ENCOUNTER — Encounter: Payer: Self-pay | Admitting: Internal Medicine

## 2022-09-23 VITALS — BP 120/72 | HR 60 | Temp 98.9°F | Ht 61.0 in | Wt 159.0 lb

## 2022-09-23 DIAGNOSIS — R739 Hyperglycemia, unspecified: Secondary | ICD-10-CM | POA: Diagnosis not present

## 2022-09-23 DIAGNOSIS — Z0001 Encounter for general adult medical examination with abnormal findings: Secondary | ICD-10-CM

## 2022-09-23 DIAGNOSIS — E041 Nontoxic single thyroid nodule: Secondary | ICD-10-CM

## 2022-09-23 DIAGNOSIS — E039 Hypothyroidism, unspecified: Secondary | ICD-10-CM | POA: Diagnosis not present

## 2022-09-23 DIAGNOSIS — M4134 Thoracogenic scoliosis, thoracic region: Secondary | ICD-10-CM

## 2022-09-23 DIAGNOSIS — E559 Vitamin D deficiency, unspecified: Secondary | ICD-10-CM | POA: Diagnosis not present

## 2022-09-23 DIAGNOSIS — E538 Deficiency of other specified B group vitamins: Secondary | ICD-10-CM

## 2022-09-23 DIAGNOSIS — E785 Hyperlipidemia, unspecified: Secondary | ICD-10-CM

## 2022-09-23 DIAGNOSIS — R3129 Other microscopic hematuria: Secondary | ICD-10-CM | POA: Insufficient documentation

## 2022-09-23 LAB — LIPID PANEL
Cholesterol: 199 mg/dL (ref 0–200)
HDL: 47.5 mg/dL (ref >39.00)
LDL Cholesterol: 124 mg/dL — ABNORMAL HIGH (ref 0–99)
NonHDL: 151.09
Total CHOL/HDL Ratio: 4
Triglycerides: 137 mg/dL (ref 0.0–149.0)
VLDL: 27.4 mg/dL (ref 0.0–40.0)

## 2022-09-23 LAB — BASIC METABOLIC PANEL
BUN: 11 mg/dL (ref 6–23)
CO2: 27 mEq/L (ref 19–32)
Calcium: 9.5 mg/dL (ref 8.4–10.5)
Chloride: 105 mEq/L (ref 96–112)
Creatinine, Ser: 0.78 mg/dL (ref 0.40–1.20)
GFR: 88.26 mL/min (ref >60.00)
Glucose, Bld: 90 mg/dL (ref 70–99)
Potassium: 4 mEq/L (ref 3.5–5.1)
Sodium: 140 mEq/L (ref 135–145)

## 2022-09-23 LAB — URINALYSIS, ROUTINE W REFLEX MICROSCOPIC
Bilirubin Urine: NEGATIVE
Ketones, ur: NEGATIVE
Leukocytes,Ua: NEGATIVE
Nitrite: NEGATIVE
Specific Gravity, Urine: >=1.03 — AB (ref 1.000–1.030)
Total Protein, Urine: NEGATIVE
Urine Glucose: NEGATIVE
Urobilinogen, UA: 0.2 (ref 0.0–1.0)
pH: 6 (ref 5.0–8.0)

## 2022-09-23 LAB — MICROALBUMIN / CREATININE URINE RATIO
Creatinine,U: 177.8 mg/dL
Microalb Creat Ratio: 2.1 mg/g (ref 0.0–30.0)
Microalb, Ur: 3.8 mg/dL — ABNORMAL HIGH (ref 0.0–1.9)

## 2022-09-23 LAB — CBC WITH DIFFERENTIAL/PLATELET
Basophils Absolute: 0 10*3/uL (ref 0.0–0.1)
Basophils Relative: 0.8 % (ref 0.0–3.0)
Eosinophils Absolute: 0 10*3/uL (ref 0.0–0.7)
Eosinophils Relative: 0.7 % (ref 0.0–5.0)
HCT: 42.6 % (ref 36.0–46.0)
Hemoglobin: 13.8 g/dL (ref 12.0–15.0)
Lymphocytes Relative: 31 % (ref 12.0–46.0)
Lymphs Abs: 1.7 10*3/uL (ref 0.7–4.0)
MCHC: 32.4 g/dL (ref 30.0–36.0)
MCV: 94.4 fl (ref 78.0–100.0)
Monocytes Absolute: 0.4 10*3/uL (ref 0.1–1.0)
Monocytes Relative: 7.7 % (ref 3.0–12.0)
Neutro Abs: 3.3 10*3/uL (ref 1.4–7.7)
Neutrophils Relative %: 59.8 % (ref 43.0–77.0)
Platelets: 281 10*3/uL (ref 150.0–400.0)
RBC: 4.51 Mil/uL (ref 3.87–5.11)
RDW: 14.4 % (ref 11.5–15.5)
WBC: 5.4 10*3/uL (ref 4.0–10.5)

## 2022-09-23 LAB — VITAMIN D 25 HYDROXY (VIT D DEFICIENCY, FRACTURES): VITD: 44.61 ng/mL (ref 30.00–100.00)

## 2022-09-23 LAB — HEPATIC FUNCTION PANEL
ALT: 12 U/L (ref 0–35)
AST: 15 U/L (ref 0–37)
Albumin: 4.4 g/dL (ref 3.5–5.2)
Alkaline Phosphatase: 54 U/L (ref 39–117)
Bilirubin, Direct: 0.1 mg/dL (ref 0.0–0.3)
Total Bilirubin: 0.5 mg/dL (ref 0.2–1.2)
Total Protein: 7.4 g/dL (ref 6.0–8.3)

## 2022-09-23 LAB — VITAMIN B12: Vitamin B-12: 798 pg/mL (ref 211–911)

## 2022-09-23 LAB — TSH: TSH: 6.47 u[IU]/mL — ABNORMAL HIGH (ref 0.35–5.50)

## 2022-09-23 LAB — T4, FREE: Free T4: 0.55 ng/dL — ABNORMAL LOW (ref 0.60–1.60)

## 2022-09-23 LAB — HEMOGLOBIN A1C: Hgb A1c MFr Bld: 5.5 % (ref 4.6–6.5)

## 2022-09-23 MED ORDER — CELECOXIB 200 MG PO CAPS: 200.0000 mg | ORAL_CAPSULE | Freq: Two times a day (BID) | ORAL | 1 refills | Status: DC | PRN

## 2022-09-23 MED ORDER — LEVOTHYROXINE SODIUM 25 MCG PO TABS: 25.0000 ug | ORAL_TABLET | Freq: Every day | ORAL | 3 refills | Status: DC

## 2022-09-23 NOTE — Progress Notes (Unsigned)
Patient ID: Chelsea Ortiz, female   DOB: [birth year], 52 y.o.   MRN: [id number]         Chief Complaint:: wellness exam and low vit d, hld, microhematuria, thyroid nodule, low thyroid, chronic lbp scoliosis       HPI:  Chelsea Ortiz is a 52 y.o. female here for wellness exam; for tdap, flu and shingrix at pharmacy, declines covid booster, o/w up to date                        Also Denies urinary symptoms such as dysuria, frequency, urgency, flank pain, hematuria or n/v, fever, chills.  Denies hyper or hypo thyroid symptoms such as voice, skin or hair change.  Pt continues to have recurring LBP without change in severity, bowel or bladder change, fever, wt loss,  worsening LE pain/numbness/weakness, gait change or falls but is asking for celebrex prn as this has helped in past.   Has lost wt with better diet and exercise, down from 176.       Wt Readings from Last 3 Encounters:  09/23/22 159 lb (72.1 kg)  03/11/22 167 lb (75.8 kg)  12/10/21 170 lb (77.1 kg)   BP Readings from Last 3 Encounters:  09/23/22 120/72  03/11/22 108/68  12/10/21 120/72   Immunization History  Administered Date(s) Administered   Influenza Inj Mdck Quad Pf 11/23/2018   Influenza,inj,Quad PF,6+ Mos 11/20/2016   Moderna Sars-Covid-2 Vaccination 04/16/2019, 05/17/2019, 04/27/2020   Tdap 11/08/2010   Health Maintenance Due  Topic Date Due   DTaP/Tdap/Td (2 - Td or Tdap) 11/07/2020   COVID-19 Vaccine (4 - 2023-24 season) 10/05/2021   Zoster Vaccines- Shingrix (1 of 2) Never done   INFLUENZA VACCINE  09/05/2022      Past Medical History:  Diagnosis Date   ADD (attention deficit disorder) 04/22/2011   Allergic rhinitis, cause unspecified 11/08/2010   Allergy    seasonal   Scoliosis of thoracic spine 04/22/2011   Thyroid nodule 12/01/2018   Past Surgical History:  Procedure Laterality Date   child birth  02/23/2002   TONSILLECTOMY  1982    reports that she quit smoking about 5 years ago. Her smoking use  included cigarettes. She started smoking about 33 years ago. She has a 14 pack-year smoking history. She has never used smokeless tobacco. She reports that she does not drink alcohol and does not use drugs. family history includes Heart disease in an other family member; Hypertension in an other family member. Allergies  Allergen Reactions   Other Hives and Other (See Comments)   Penicillins     hives   Current Outpatient Medications on File Prior to Visit  Medication Sig Dispense Refill   albuterol (VENTOLIN HFA) 108 (90 Base) MCG/ACT inhaler INHALE 2 PUFFS BY MOUTH 4 TIMES A DAY AS NEEDED 8.5 each 2   ALPRAZolam (XANAX) 0.5 MG tablet TAKE 1 TABLET BY MOUTH 2 TIMES DAILY AS NEEDED. 60 tablet 3   levonorgestrel (MIRENA, 52 MG,) 20 MCG/DAY IUD Take 1 device by intrauterine route.     VITAMIN D, ERGOCALCIFEROL, PO Take by mouth once a week. 10000 IU     amphetamine-dextroamphetamine (ADDERALL) 20 MG tablet Take 1 tablet (20 mg total) by mouth 2 (two) times daily. (Patient not taking: Reported on 09/23/2022) 60 tablet 0   No current facility-administered medications on file prior to visit.        ROS:  All others reviewed and negative.  Objective        PE:  BP 120/72 (BP Location: Left Arm, Patient Position: Sitting, Cuff Size: Normal)   Pulse 60   Temp 98.9 F (37.2 C) (Oral)   Ht 5\' 1"  (1.549 m)   Wt 159 lb (72.1 kg)   SpO2 99%   BMI 30.04 kg/m                 Constitutional: Pt appears in NAD               HENT: Head: NCAT.                Right Ear: External ear normal.                 Left Ear: External ear normal.                Eyes: . Pupils are equal, round, and reactive to light. Conjunctivae and EOM are normal               Nose: without d/c or deformity               Neck: Neck supple. Gross normal ROM, + right thyroid nodule nontender               Cardiovascular: Normal rate and regular rhythm.                 Pulmonary/Chest: Effort normal and breath sounds without  rales or wheezing.                Abd:  Soft, NT, ND, + BS, no organomegaly               Neurological: Pt is alert. At baseline orientation, motor grossly intact               Skin: Skin is warm. No rashes, no other new lesions, LE edema - none               Psychiatric: Pt behavior is normal without agitation   Micro: none  Cardiac tracings I have personally interpreted today:  none  Pertinent Radiological findings (summarize): none   Lab Results  Component Value Date   WBC 5.4 09/23/2022   HGB 13.8 09/23/2022   HCT 42.6 09/23/2022   PLT 281.0 09/23/2022   GLUCOSE 90 09/23/2022   CHOL 199 09/23/2022   TRIG 137.0 09/23/2022   HDL 47.50 09/23/2022   LDLDIRECT 123.0 09/20/2021   LDLCALC 124 (H) 09/23/2022   ALT 12 09/23/2022   AST 15 09/23/2022   NA 140 09/23/2022   K 4.0 09/23/2022   CL 105 09/23/2022   CREATININE 0.78 09/23/2022   BUN 11 09/23/2022   CO2 27 09/23/2022   TSH 6.47 (H) 09/23/2022   HGBA1C 5.5 09/23/2022   MICROALBUR 3.8 (H) 09/23/2022   Assessment/Plan:  Chelsea Ortiz is a 52 y.o. White or Caucasian [1] female with  has a past medical history of ADD (attention deficit disorder) (04/22/2011), Allergic rhinitis, cause unspecified (11/08/2010), Allergy, Scoliosis of thoracic spine (04/22/2011), and Thyroid nodule (12/01/2018).  Encounter for well adult exam with abnormal findings Age and sex appropriate education and counseling updated with regular exercise and diet Referrals for preventative services - none needed Immunizations addressed - declines covid booster, for tdap, flu and shingrix at pharmacy Smoking counseling  - none needed Evidence for depression or other mood disorder - none significant Most recent labs reviewed. I have personally reviewed  and have noted: 1) the patient's medical and social history 2) The patient's current medications and supplements 3) The patient's height, weight, and BMI have been recorded in the chart   Acquired  hypothyroidism Lab Results  Component Value Date   TSH 6.47 (H) 09/23/2022   Has been off levothyroxine, pt willing to restart levothyroxine 25 mcg qd   HLD (hyperlipidemia) Lab Results  Component Value Date   LDLCALC 124 (H) 09/23/2022   Uncontrolled, for lower chol diet, pt declines statin for now   Hyperglycemia Lab Results  Component Value Date   HGBA1C 5.5 09/23/2022   Stable, pt to continue current medical treatment  - diet, wt control   Microhematuria Asympt, for f/u lab today  Thyroid nodule Asympt, for f/u thyroid US  Vitamin D deficiency Last vitamin D Lab Results  Component Value Date   VD25OH 44.61 09/23/2022   Stable, cont oral replacement   Scoliosis of thoracic spine Ok for celebrex 200 bid prn Followup: Return in about 1 year (around 09/23/2023), or if symptoms worsen or fail to improve.  Oliver Barre, MD 09/26/2022 9:11 PM [facility]

## 2022-09-26 ENCOUNTER — Encounter: Payer: Self-pay | Admitting: Internal Medicine

## 2022-09-26 NOTE — Assessment & Plan Note (Addendum)
Ok for celebrex 200 bid prn

## 2022-09-26 NOTE — Assessment & Plan Note (Signed)
Asympt, for f/u lab today 

## 2022-09-26 NOTE — Assessment & Plan Note (Signed)
Lab Results  Component Value Date   TSH 6.47 (H) 09/23/2022   Has been off levothyroxine, pt willing to restart levothyroxine 25 mcg qd

## 2022-09-26 NOTE — Assessment & Plan Note (Signed)
Lab Results  Component Value Date   HGBA1C 5.5 09/23/2022   Stable, pt to continue current medical treatment  - diet, wt control

## 2022-09-26 NOTE — Patient Instructions (Signed)
Please continue all other medications as before, and refills have been done if requested.  Please have the pharmacy call with any other refills you may need.  Please continue your efforts at being more active, low cholesterol diet, and weight control.  You are otherwise up to date with prevention measures today.  Please keep your appointments with your specialists as you may have planned  You will be contacted regarding the referral for: thyroid u/s  Ok to take celebrex 200 mg as needed

## 2022-09-26 NOTE — Assessment & Plan Note (Signed)
Last vitamin D Lab Results  Component Value Date   VD25OH 44.61 09/23/2022   Stable, cont oral replacement

## 2022-09-26 NOTE — Assessment & Plan Note (Signed)
Age and sex appropriate education and counseling updated with regular exercise and diet Referrals for preventative services - none needed Immunizations addressed - declines covid booster, for tdap, flu and shingrix at pharmacy Smoking counseling  - none needed Evidence for depression or other mood disorder - none significant Most recent labs reviewed. I have personally reviewed and have noted: 1) the patient's medical and social history 2) The patient's current medications and supplements 3) The patient's height, weight, and BMI have been recorded in the chart

## 2022-09-26 NOTE — Assessment & Plan Note (Signed)
Lab Results  Component Value Date   LDLCALC 124 (H) 09/23/2022   Uncontrolled, for lower chol diet, pt declines statin for now

## 2022-09-26 NOTE — Assessment & Plan Note (Signed)
Asympt, for f/u thyroid US

## 2022-10-08 ENCOUNTER — Ambulatory Visit
Admission: RE | Admit: 2022-10-08 | Discharge: 2022-10-08 | Disposition: A | Discharge status: Home / Self Care | Payer: 59 | Source: Ambulatory Visit | Attending: Internal Medicine | Admitting: Internal Medicine

## 2022-10-08 DIAGNOSIS — E041 Nontoxic single thyroid nodule: Secondary | ICD-10-CM

## 2022-10-13 ENCOUNTER — Other Ambulatory Visit: Payer: Self-pay | Admitting: Internal Medicine

## 2022-10-14 ENCOUNTER — Other Ambulatory Visit: Payer: Self-pay

## 2022-10-22 DIAGNOSIS — M20011 Mallet finger of right finger(s): Secondary | ICD-10-CM | POA: Insufficient documentation

## 2022-11-28 ENCOUNTER — Other Ambulatory Visit: Payer: Self-pay | Admitting: Internal Medicine

## 2023-01-06 ENCOUNTER — Other Ambulatory Visit: Payer: Self-pay

## 2023-01-06 ENCOUNTER — Other Ambulatory Visit: Payer: Self-pay | Admitting: Internal Medicine

## 2023-02-27 ENCOUNTER — Telehealth: Payer: Self-pay | Admitting: Internal Medicine

## 2023-02-27 NOTE — Telephone Encounter (Signed)
Copied from CRM [id number]. Topic: Clinical - Medication Refill >> Feb 27, 2023  8:44 AM Steele Sizer wrote: Most Recent Primary Care Visit:  Provider: Corwin Levins  Department: [facility]  Visit Type: PHYSICAL  Date: 09/23/2022  Medication: Adderall 20 MG   Has the patient contacted their pharmacy? No (Agent: If no, request that the patient contact the pharmacy for the refill. If patient does not wish to contact the pharmacy document the reason why and proceed with request.) (Agent: If yes, when and what did the pharmacy advise?) Pt stated that the pharmacy informs her to contact us   Is this the correct pharmacy for this prescription? Yes If no, delete pharmacy and type the correct one.  This is the patient's preferred pharmacy:  [id number] - [place], [place] - [address] AT [facility] [address] [place] [address] Phone: [phone] Fax: [phone]  Has the prescription been filled recently? No  Is the patient out of the medication? Yes  Has the patient been seen for an appointment in the last year OR does the patient have an upcoming appointment?   Can we respond through MyChart?   Agent: Please be advised that Rx refills may take up to 3 business days. We ask that you follow-up with your pharmacy.

## 2023-03-04 MED ORDER — AMPHETAMINE-DEXTROAMPHETAMINE 20 MG PO TABS: 20.0000 mg | ORAL_TABLET | Freq: Two times a day (BID) | ORAL | 0 refills | Status: DC

## 2023-03-04 NOTE — Telephone Encounter (Signed)
Ok done erx to [facility]

## 2023-03-04 NOTE — Telephone Encounter (Signed)
Prescription Request  03/04/2023  LOV: 09/23/2022  What is the name of the medication or equipment? amphetamine-dextroamphetamine (ADDERALL) 20 MG tablet   Have you contacted your pharmacy to request a refill? Yes   Which pharmacy would you like this sent to?  [id number] [place], [place] - [address] AT [facility] [address] [place] [address] Phone: [phone] Fax: [phone]    Patient notified that their request is being sent to the clinical staff for review and that they should receive a response within 2 business days.   Please advise at Mobile [phone] (mobile)

## 2023-03-26 ENCOUNTER — Other Ambulatory Visit: Payer: Self-pay

## 2023-03-26 ENCOUNTER — Other Ambulatory Visit: Payer: Self-pay | Admitting: Internal Medicine

## 2023-05-15 ENCOUNTER — Other Ambulatory Visit: Payer: Self-pay | Admitting: Internal Medicine

## 2023-06-02 ENCOUNTER — Other Ambulatory Visit: Payer: Self-pay | Admitting: Internal Medicine

## 2023-06-02 ENCOUNTER — Other Ambulatory Visit: Payer: Self-pay

## 2023-06-02 NOTE — Telephone Encounter (Signed)
 Copied from CRM [id number]. Topic: Clinical - Medication Refill >> Jun 02, 2023  4:21 PM [name] wrote: Most Recent Primary Care Visit:  Provider: Roslyn Coombe  Department: [facility]  Visit Type: PHYSICAL  Date: 09/23/2022  Medication: ADDERALL   Has the patient contacted their pharmacy? Yes (Agent: If no, request that the patient contact the pharmacy for the refill. If patient does not wish to contact the pharmacy document the reason why and proceed with request.) (Agent: If yes, when and what did the pharmacy advise?)  Is this the correct pharmacy for this prescription? Yes If no, delete pharmacy and type the correct one.  This is the patient's preferred pharmacy:   [id number] - [place], [place] - [address] AT [facility] [address] [place] [address] Phone: [phone] Fax: [phone]   Has the prescription been filled recently? Yes  Is the patient out of the medication? No  Has the patient been seen for an appointment in the last year OR does the patient have an upcoming appointment? Yes  Can we respond through MyChart? No  Agent: Please be advised that Rx refills may take up to 3 business days. We ask that you follow-up with your pharmacy.

## 2023-06-03 MED ORDER — AMPHETAMINE-DEXTROAMPHETAMINE 20 MG PO TABS: 20.0000 mg | ORAL_TABLET | Freq: Two times a day (BID) | ORAL | 0 refills | Status: DC

## 2023-08-27 ENCOUNTER — Other Ambulatory Visit: Payer: Self-pay | Admitting: Internal Medicine

## 2023-09-21 ENCOUNTER — Other Ambulatory Visit: Payer: Self-pay | Admitting: Internal Medicine

## 2023-09-25 ENCOUNTER — Encounter: Payer: Self-pay | Admitting: Internal Medicine

## 2023-09-25 ENCOUNTER — Ambulatory Visit: Payer: Self-pay | Admitting: Internal Medicine

## 2023-09-25 ENCOUNTER — Ambulatory Visit: Admitting: Internal Medicine

## 2023-09-25 VITALS — BP 122/86 | HR 64 | Temp 98.6°F | Ht 61.0 in | Wt 167.4 lb

## 2023-09-25 DIAGNOSIS — E039 Hypothyroidism, unspecified: Secondary | ICD-10-CM | POA: Diagnosis not present

## 2023-09-25 DIAGNOSIS — R739 Hyperglycemia, unspecified: Secondary | ICD-10-CM

## 2023-09-25 DIAGNOSIS — N951 Menopausal and female climacteric states: Secondary | ICD-10-CM

## 2023-09-25 DIAGNOSIS — E559 Vitamin D deficiency, unspecified: Secondary | ICD-10-CM

## 2023-09-25 DIAGNOSIS — R5383 Other fatigue: Secondary | ICD-10-CM | POA: Diagnosis not present

## 2023-09-25 DIAGNOSIS — Z0001 Encounter for general adult medical examination with abnormal findings: Secondary | ICD-10-CM

## 2023-09-25 DIAGNOSIS — E785 Hyperlipidemia, unspecified: Secondary | ICD-10-CM

## 2023-09-25 DIAGNOSIS — E538 Deficiency of other specified B group vitamins: Secondary | ICD-10-CM | POA: Diagnosis not present

## 2023-09-25 LAB — BASIC METABOLIC PANEL WITH GFR
BUN: 11 mg/dL (ref 6–23)
CO2: 28 meq/L (ref 19–32)
Calcium: 9.1 mg/dL (ref 8.4–10.5)
Chloride: 104 meq/L (ref 96–112)
Creatinine, Ser: 0.76 mg/dL (ref 0.40–1.20)
GFR: 90.41 mL/min (ref >60.00)
Glucose, Bld: 87 mg/dL (ref 70–99)
Potassium: 4.7 meq/L (ref 3.5–5.1)
Sodium: 139 meq/L (ref 135–145)

## 2023-09-25 LAB — FOLLICLE STIMULATING HORMONE: FSH: 98.6 m[IU]/mL

## 2023-09-25 LAB — URINALYSIS, ROUTINE W REFLEX MICROSCOPIC
Bilirubin Urine: NEGATIVE
Ketones, ur: NEGATIVE
Nitrite: NEGATIVE
Specific Gravity, Urine: 1.025 (ref 1.000–1.030)
Total Protein, Urine: NEGATIVE
Urine Glucose: NEGATIVE
Urobilinogen, UA: 0.2 (ref 0.0–1.0)
pH: 6 (ref 5.0–8.0)

## 2023-09-25 LAB — HEPATIC FUNCTION PANEL
ALT: 14 U/L (ref 0–35)
AST: 17 U/L (ref 0–37)
Albumin: 4.5 g/dL (ref 3.5–5.2)
Alkaline Phosphatase: 58 U/L (ref 39–117)
Bilirubin, Direct: 0 mg/dL (ref 0.0–0.3)
Total Bilirubin: 0.4 mg/dL (ref 0.2–1.2)
Total Protein: 7.6 g/dL (ref 6.0–8.3)

## 2023-09-25 LAB — CBC WITH DIFFERENTIAL/PLATELET
Basophils Absolute: 0 K/uL (ref 0.0–0.1)
Basophils Relative: 0.7 % (ref 0.0–3.0)
Eosinophils Absolute: 0 K/uL (ref 0.0–0.7)
Eosinophils Relative: 1 % (ref 0.0–5.0)
HCT: 41.9 % (ref 36.0–46.0)
Hemoglobin: 13.8 g/dL (ref 12.0–15.0)
Lymphocytes Relative: 27.2 % (ref 12.0–46.0)
Lymphs Abs: 1.3 K/uL (ref 0.7–4.0)
MCHC: 32.9 g/dL (ref 30.0–36.0)
MCV: 92.7 fl (ref 78.0–100.0)
Monocytes Absolute: 0.6 K/uL (ref 0.1–1.0)
Monocytes Relative: 11.3 % (ref 3.0–12.0)
Neutro Abs: 3 K/uL (ref 1.4–7.7)
Neutrophils Relative %: 59.8 % (ref 43.0–77.0)
Platelets: 244 K/uL (ref 150.0–400.0)
RBC: 4.52 Mil/uL (ref 3.87–5.11)
RDW: 14 % (ref 11.5–15.5)
WBC: 4.9 K/uL (ref 4.0–10.5)

## 2023-09-25 LAB — LIPID PANEL
Cholesterol: 189 mg/dL (ref 0–200)
HDL: 51.9 mg/dL (ref >39.00)
LDL Cholesterol: 110 mg/dL — ABNORMAL HIGH (ref 0–99)
NonHDL: 137.48
Total CHOL/HDL Ratio: 4
Triglycerides: 135 mg/dL (ref 0.0–149.0)
VLDL: 27 mg/dL (ref 0.0–40.0)

## 2023-09-25 LAB — VITAMIN B12: Vitamin B-12: 864 pg/mL (ref 211–911)

## 2023-09-25 LAB — HEMOGLOBIN A1C: Hgb A1c MFr Bld: 5.9 % (ref 4.6–6.5)

## 2023-09-25 LAB — VITAMIN D 25 HYDROXY (VIT D DEFICIENCY, FRACTURES): VITD: 40.35 ng/mL (ref 30.00–100.00)

## 2023-09-25 LAB — CORTISOL: Cortisol, Plasma: 8.3 ug/dL

## 2023-09-25 MED ORDER — AMPHETAMINE-DEXTROAMPHETAMINE 20 MG PO TABS: 20.0000 mg | ORAL_TABLET | Freq: Two times a day (BID) | ORAL | 0 refills | Status: DC

## 2023-09-25 MED ORDER — CELECOXIB 200 MG PO CAPS: 200.0000 mg | ORAL_CAPSULE | Freq: Two times a day (BID) | ORAL | 1 refills | Status: AC | PRN

## 2023-09-25 MED ORDER — ALPRAZOLAM 0.5 MG PO TABS: 0.5000 mg | ORAL_TABLET | Freq: Two times a day (BID) | ORAL | 2 refills | Status: DC | PRN

## 2023-09-25 NOTE — Assessment & Plan Note (Signed)
 Age and sex appropriate education and counseling updated with regular exercise and diet Referrals for preventative services - pt has GYN exam and mammogram sched for next wk Immunizations addressed - for flu shot at pharmacy Smoking counseling  - none needed Evidence for depression or other mood disorder - none significant Most recent labs reviewed. I have personally reviewed and have noted: 1) the patient's medical and social history 2) The patient's current medications and supplements 3) The patient's height, weight, and BMI have been recorded in the chart

## 2023-09-25 NOTE — Assessment & Plan Note (Addendum)
 Also for cortisol with labs, has IUD and not menses

## 2023-09-25 NOTE — Assessment & Plan Note (Signed)
 Lab Results  Component Value Date   HGBA1C 5.9 09/25/2023   Stable, pt to continue current medical treatment  - diet, wt control

## 2023-09-25 NOTE — Assessment & Plan Note (Signed)
 Last vitamin D  Lab Results  Component Value Date   VD25OH 40.35 09/25/2023   Stable, cont oral replacement

## 2023-09-25 NOTE — Assessment & Plan Note (Signed)
 With recent hot flashes - for [facility] with labs

## 2023-09-25 NOTE — Assessment & Plan Note (Signed)
 Lab Results  Component Value Date   TSH 6.47 (H) 09/23/2022   Uncontrolled, now on low dose synthroid  25 mcg every day, , pt to continue levothyroxine  and f/u with thyroid  panel today

## 2023-09-25 NOTE — Patient Instructions (Signed)

## 2023-09-25 NOTE — Progress Notes (Signed)
 Patient ID: Chelsea Ortiz, female   DOB: 1970/12/15, 53 y.o.   MRN: [id number]         Chief Complaint:: wellness exam and low thyroid , hld, menopausal symptoms, hyperglycemia       HPI:  Chelsea Ortiz is a 53 y.o. female here for wellness exam; for GYN and mammogram next wk; for flu shot at pharmacy, o/w up to date                        Also Pt denies chest pain, increased sob or doe, wheezing, orthopnea, PND, increased LE swelling, palpitations, dizziness or syncope.   Pt denies polydipsia, polyuria, or new focal neuro s/s.    Pt denies fever, wt loss, night sweats, loss of appetite, or other constitutional symptoms  Does c/o ongoing fatigue, but denies signficant daytime hypersomnolence.     Wt Readings from Last 3 Encounters:  09/25/23 167 lb 6.4 oz (75.9 kg)  09/23/22 159 lb (72.1 kg)  03/11/22 167 lb (75.8 kg)   BP Readings from Last 3 Encounters:  09/25/23 122/86  09/23/22 120/72  03/11/22 108/68   Immunization History  Administered Date(s) Administered   Influenza Inj Mdck Quad Pf 11/23/2018   Influenza,inj,Quad PF,6+ Mos 11/20/2016   Moderna Sars-Covid-2 Vaccination 04/16/2019, 05/17/2019, 04/27/2020   Tdap 11/08/2010   Health Maintenance Due  Topic Date Due   Cervical Cancer Screening (HPV/Pap Cotest)  Never done   MAMMOGRAM  10/24/2021   INFLUENZA VACCINE  09/05/2023      Past Medical History:  Diagnosis Date   ADD (attention deficit disorder) 04/22/2011   Allergic rhinitis, cause unspecified 11/08/2010   Allergy    seasonal   Scoliosis of thoracic spine 04/22/2011   Thyroid  nodule 12/01/2018   Past Surgical History:  Procedure Laterality Date   child birth  02/23/2003   TONSILLECTOMY  1982    reports that she quit smoking about 6 years ago. Her smoking use included cigarettes. She started smoking about 34 years ago. She has a 14 pack-year smoking history. She has never used smokeless tobacco. She reports that she does not drink alcohol and does not use  drugs. family history includes Heart disease in an other family member; Hypertension in an other family member. Allergies  Allergen Reactions   Other Hives and Other (See Comments)   Penicillins     hives   Current Outpatient Medications on File Prior to Visit  Medication Sig Dispense Refill   albuterol  (VENTOLIN  HFA) 108 (90 Base) MCG/ACT inhaler INHALE 2 PUFFS BY MOUTH 4 TIMES A DAY AS NEEDED 8.5 each 2   levonorgestrel (MIRENA, 52 MG,) 20 MCG/DAY IUD Take 1 device by intrauterine route.     levothyroxine  (SYNTHROID ) 25 MCG tablet TAKE 1 TABLET BY MOUTH DAILY BEFORE BREAKFAST. 90 tablet 0   VITAMIN D , ERGOCALCIFEROL , PO Take by mouth once a week. 10000 IU     No current facility-administered medications on file prior to visit.        ROS:  All others reviewed and negative.  Objective        PE:  BP 122/86   Pulse 64   Temp 98.6 F (37 C) (Oral)   Ht 5' 1 (1.549 m)   Wt 167 lb 6.4 oz (75.9 kg)   SpO2 96%   BMI 31.63 kg/m                 Constitutional: Pt appears in NAD  HENT: Head: NCAT.                Right Ear: External ear normal.                 Left Ear: External ear normal.                Eyes: . Pupils are equal, round, and reactive to light. Conjunctivae and EOM are normal               Nose: without d/c or deformity               Neck: Neck supple. Gross normal ROM               Cardiovascular: Normal rate and regular rhythm.                 Pulmonary/Chest: Effort normal and breath sounds without rales or wheezing.                Abd:  Soft, NT, ND, + BS, no organomegaly               Neurological: Pt is alert. At baseline orientation, motor grossly intact               Skin: Skin is warm. No rashes, no other new lesions, LE edema - none               Psychiatric: Pt behavior is normal without agitation   Micro: none  Cardiac tracings I have personally interpreted today:  none  Pertinent Radiological findings (summarize): none   Lab Results   Component Value Date   WBC 4.9 09/25/2023   HGB 13.8 09/25/2023   HCT 41.9 09/25/2023   PLT 244.0 09/25/2023   GLUCOSE 87 09/25/2023   CHOL 189 09/25/2023   TRIG 135.0 09/25/2023   HDL 51.90 09/25/2023   LDLDIRECT 123.0 09/20/2021   LDLCALC 110 (H) 09/25/2023   ALT 14 09/25/2023   AST 17 09/25/2023   NA 139 09/25/2023   K 4.7 09/25/2023   CL 104 09/25/2023   CREATININE 0.76 09/25/2023   BUN 11 09/25/2023   CO2 28 09/25/2023   TSH 6.47 (H) 09/23/2022   HGBA1C 5.9 09/25/2023   Assessment/Plan:  Chelsea Ortiz is a 53 y.o. White or Caucasian [1] female with  has a past medical history of ADD (attention deficit disorder) (04/22/2011), Allergic rhinitis, cause unspecified (11/08/2010), Allergy, Scoliosis of thoracic spine (04/22/2011), and Thyroid  nodule (12/01/2018).  Encounter for well adult exam with abnormal findings Age and sex appropriate education and counseling updated with regular exercise and diet Referrals for preventative services - pt has GYN exam and mammogram sched for next wk Immunizations addressed - for flu shot at pharmacy Smoking counseling  - none needed Evidence for depression or other mood disorder - none significant Most recent labs reviewed. I have personally reviewed and have noted: 1) the patient's medical and social history 2) The patient's current medications and supplements 3) The patient's height, weight, and BMI have been recorded in the chart   Acquired hypothyroidism Lab Results  Component Value Date   TSH 6.47 (H) 09/23/2022   Uncontrolled, now on low dose synthroid  25 mcg every day, , pt to continue levothyroxine  and f/u with thyroid  panel today   HLD (hyperlipidemia) Lab Results  Component Value Date   LDLCALC 110 (H) 09/25/2023   uncontrolled, pt to continue lower chol diet, declines statin   Hyperglycemia Lab  Results  Component Value Date   HGBA1C 5.9 09/25/2023   Stable, pt to continue current medical treatment  - diet,  wt control   Vitamin D  deficiency Last vitamin D  Lab Results  Component Value Date   VD25OH 40.35 09/25/2023   Stable, cont oral replacement   Fatigue Also for cortisol with labs, has IUD and not menses  Menopausal symptoms With recent hot flashes - for [facility] with labs  Followup: Return in about 1 year (around 09/24/2024).  Lynwood Rush, MD 09/25/2023 8:05 PM [facility] [place] [facility] - [facility]

## 2023-09-25 NOTE — Assessment & Plan Note (Signed)
 Lab Results  Component Value Date   LDLCALC 110 (H) 09/25/2023   uncontrolled, pt to continue lower chol diet, declines statin

## 2023-09-26 ENCOUNTER — Other Ambulatory Visit: Payer: Self-pay | Admitting: Internal Medicine

## 2023-09-26 DIAGNOSIS — E039 Hypothyroidism, unspecified: Secondary | ICD-10-CM

## 2023-09-26 LAB — THYROID PANEL WITH TSH
Free Thyroxine Index: 1.7 (ref 1.4–3.8)
T3 Uptake: 28 % (ref 22–35)
T4, Total: 6 ug/dL (ref 5.1–11.9)
TSH: 9.18 m[IU]/L — ABNORMAL HIGH

## 2023-10-28 ENCOUNTER — Other Ambulatory Visit (INDEPENDENT_AMBULATORY_CARE_PROVIDER_SITE_OTHER)

## 2023-10-28 DIAGNOSIS — E039 Hypothyroidism, unspecified: Secondary | ICD-10-CM | POA: Diagnosis not present

## 2023-10-29 ENCOUNTER — Ambulatory Visit: Payer: Self-pay | Admitting: Internal Medicine

## 2023-10-29 ENCOUNTER — Encounter: Payer: Self-pay | Admitting: Internal Medicine

## 2023-10-29 LAB — THYROID PANEL WITH TSH
Free Thyroxine Index: 1.9 (ref 1.4–3.8)
T3 Uptake: 28 % (ref 22–35)
T4, Total: 6.7 ug/dL (ref 5.1–11.9)
TSH: 2.42 m[IU]/L

## 2023-10-30 MED ORDER — LEVOTHYROXINE SODIUM 50 MCG PO TABS: 50.0000 ug | ORAL_TABLET | Freq: Every day | ORAL | 3 refills | Status: AC

## 2024-01-07 ENCOUNTER — Other Ambulatory Visit: Payer: Self-pay | Admitting: Internal Medicine

## 2024-01-15 ENCOUNTER — Telehealth: Payer: Self-pay | Admitting: Internal Medicine

## 2024-01-15 MED ORDER — AMPHETAMINE-DEXTROAMPHETAMINE 20 MG PO TABS: 20.0000 mg | ORAL_TABLET | Freq: Two times a day (BID) | ORAL | 0 refills | Status: DC

## 2024-01-15 NOTE — Telephone Encounter (Signed)
 Copied from CRM #[id number]. Topic: Clinical - Medication Refill >> Jan 15, 2024  1:08 PM Drema MATSU wrote: Medication: amphetamine -dextroamphetamine  (ADDERALL ) 20 MG tablet  Has the patient contacted their pharmacy? Yes (Agent: If no, request that the patient contact the pharmacy for the refill. If patient does not wish to contact the pharmacy document the reason why and proceed with request.) no refills  (Agent: If yes, when and what did the pharmacy advise?)  This is the patient's preferred pharmacy:  [id number] [place], [place] - [address] AT [facility] [address] [place] [address] Phone: [phone] Fax: [phone] Is this the correct pharmacy for this prescription? Yes If no, delete pharmacy and type the correct one.   Has the prescription been filled recently? No  Is the patient out of the medication? No  Has the patient been seen for an appointment in the last year OR does the patient have an upcoming appointment? Yes  Can we respond through MyChart? Yes  Agent: Please be advised that Rx refills may take up to 3 business days. We ask that you follow-up with your pharmacy.

## 2024-02-11 ENCOUNTER — Ambulatory Visit: Payer: Self-pay

## 2024-02-11 NOTE — Telephone Encounter (Signed)
 FYI Only or Action Required?: FYI only for provider: appointment scheduled on 02/13/2024.  Patient was last seen in primary care on 09/25/2023 by Norleen Lynwood ORN, MD.  Called Nurse Triage reporting Sinusitis.  Symptoms began several weeks ago.  Interventions attempted: OTC medications: cold and flu and Rest, hydration, or home remedies.  Symptoms are: unchanged.  Triage Disposition: See PCP When Office is Open (Within 3 Days)  Patient/caregiver understands and will follow disposition?:   Copied from CRM #[id number]. Topic: Clinical - Red Word Triage >> Feb 11, 2024  9:25 AM Revonda D wrote: Red Word that prompted transfer to Nurse Triage: productive cough, headache  Pt stated that she has a productive cough which has been ongoing for 2 weeks now. Pt stated that her sinus hurts, down by her ear hurts, and she is also experiencing a headache. Pt would like to get an appt scheduled with her provider.    ----------------------------------------------------------------------- From previous Reason for Contact - Scheduling: Patient/patient representative is calling to schedule an appointment. Refer to attachments for appointment information. Reason for Disposition  [1] Sinus congestion (pressure, fullness) AND [2] present > 10 days  Answer Assessment - Initial Assessment Questions Daughter has COVID and Flu, another has strep, mother has COVID. Patient took at home COVID test and it was negative. Promethazine  for cough at night. 1. LOCATION: Where does it hurt?      Right side of face and neck, thinks she has a  2. ONSET: When did the sinus pain start?  (e.g., hours, days)      Around christmas 5. NASAL CONGESTION: Is the nose blocked? If Yes, ask: Can you open it or must you breathe through your mouth?     At times on right side 6. NASAL DISCHARGE: Do you have discharge from your nose? If so ask, What color?     Sometime very runny nose of clear drainage, then it's completely dry 7.  FEVER: Do you have a fever? If Yes, ask: What is it, how was it measured, and when did it start?      Denies 8. OTHER SYMPTOMS: Do you have any other symptoms? (e.g., sore throat, cough, earache, difficulty breathing)  dry cough, headache  Protocols used: Sinus Pain or Congestion-A-AH

## 2024-02-13 ENCOUNTER — Ambulatory Visit: Admitting: Internal Medicine

## 2024-02-13 ENCOUNTER — Encounter: Payer: Self-pay | Admitting: Internal Medicine

## 2024-02-13 VITALS — BP 122/76 | HR 85 | Temp 99.5°F | Ht 61.0 in | Wt 171.0 lb

## 2024-02-13 DIAGNOSIS — E559 Vitamin D deficiency, unspecified: Secondary | ICD-10-CM | POA: Diagnosis not present

## 2024-02-13 DIAGNOSIS — R058 Other specified cough: Secondary | ICD-10-CM

## 2024-02-13 DIAGNOSIS — R739 Hyperglycemia, unspecified: Secondary | ICD-10-CM | POA: Diagnosis not present

## 2024-02-13 MED ORDER — AZITHROMYCIN 250 MG PO TABS: ORAL_TABLET | ORAL | 1 refills | Status: AC

## 2024-02-13 MED ORDER — HYDROCODONE BIT-HOMATROP MBR 5-1.5 MG/5ML PO SOLN: 5.0000 mL | Freq: Four times a day (QID) | ORAL | 0 refills | Status: DC | PRN

## 2024-02-13 MED ORDER — AMPHETAMINE-DEXTROAMPHETAMINE 20 MG PO TABS: 20.0000 mg | ORAL_TABLET | Freq: Two times a day (BID) | ORAL | 0 refills | Status: AC

## 2024-02-13 MED ORDER — ALPRAZOLAM 0.5 MG PO TABS: 0.5000 mg | ORAL_TABLET | Freq: Two times a day (BID) | ORAL | 2 refills | Status: AC | PRN

## 2024-02-13 NOTE — Progress Notes (Unsigned)
 Patient ID: Chelsea Ortiz, female   DOB: 10-16-1970, 54 y.o.   MRN: [id number]        Chief Complaint: follow up productive cough, hyperglycemia, low vit        HPI:  Chelsea MACY is a 54 y.o. female Here with acute onset mild to mod 2-3 days ST, HA, general weakness and malaise, with prod cough greenish sputum, but Pt denies chest pain, increased sob or doe, wheezing, orthopnea, PND, increased LE swelling, palpitations, dizziness or syncope.   Pt denies polydipsia, polyuria, or new focal neuro s/s.          Wt Readings from Last 3 Encounters:  02/13/24 171 lb (77.6 kg)  09/25/23 167 lb 6.4 oz (75.9 kg)  09/23/22 159 lb (72.1 kg)   BP Readings from Last 3 Encounters:  02/13/24 122/76  09/25/23 122/86  09/23/22 120/72         Past Medical History:  Diagnosis Date   ADD (attention deficit disorder) 04/22/2011   Allergic rhinitis, cause unspecified 11/08/2010   Allergy    seasonal   Scoliosis of thoracic spine 04/22/2011   Thyroid  nodule 12/01/2018   Past Surgical History:  Procedure Laterality Date   child birth  02/23/2002   TONSILLECTOMY  1982    reports that she quit smoking about 6 years ago. Her smoking use included cigarettes. She started smoking about 34 years ago. She has a 14 pack-year smoking history. She has never used smokeless tobacco. She reports that she does not drink alcohol and does not use drugs. family history includes Heart disease in an other family member; Hypertension in an other family member. Allergies[1] Medications Ordered Prior to Encounter[2]      ROS:  All others reviewed and negative.  Objective        PE:  BP 122/76 (BP Location: Right Arm, Patient Position: Sitting, Cuff Size: Normal)   Pulse 85   Temp 99.5 F (37.5 C) (Oral)   Ht 5' 1 (1.549 m)   Wt 171 lb (77.6 kg)   SpO2 97%   BMI 32.31 kg/m                 Constitutional: Pt appears mild ill               HENT: Head: NCAT.                Right Ear: External ear normal.                  Left Ear: External ear normal. Bilat tm's with mild erythema.  Max sinus areas non tender.  Pharynx with mild erythema, no exudate               Eyes: . Pupils are equal, round, and reactive to light. Conjunctivae and EOM are normal               Nose: without d/c or deformity               Neck: Neck supple. Gross normal ROM               Cardiovascular: Normal rate and regular rhythm.                 Pulmonary/Chest: Effort normal and breath sounds without rales or wheezing.                            Neurological:  Pt is alert. At baseline orientation, motor grossly intact               Skin: Skin is warm. No rashes, no other new lesions, LE edema - none               Psychiatric: Pt behavior is normal without agitation   Micro: none  Cardiac tracings I have personally interpreted today:  none  Pertinent Radiological findings (summarize): none   Lab Results  Component Value Date   WBC 4.9 09/25/2023   HGB 13.8 09/25/2023   HCT 41.9 09/25/2023   PLT 244.0 09/25/2023   GLUCOSE 87 09/25/2023   CHOL 189 09/25/2023   TRIG 135.0 09/25/2023   HDL 51.90 09/25/2023   LDLDIRECT 123.0 09/20/2021   LDLCALC 110 (H) 09/25/2023   ALT 14 09/25/2023   AST 17 09/25/2023   NA 139 09/25/2023   K 4.7 09/25/2023   CL 104 09/25/2023   CREATININE 0.76 09/25/2023   BUN 11 09/25/2023   CO2 28 09/25/2023   TSH 2.42 10/28/2023   HGBA1C 5.9 09/25/2023   Assessment/Plan:  [name] is a 54 y.o. White or Caucasian [1] female with  has a past medical history of ADD (attention deficit disorder) (04/22/2011), Allergic rhinitis, cause unspecified (11/08/2010), Allergy, Scoliosis of thoracic spine (04/22/2011), and Thyroid  nodule (12/01/2018).  Vitamin D  deficiency Last vitamin D  Lab Results  Component Value Date   VD25OH 40.35 09/25/2023   Stable, cont oral replacement   Productive cough Mild to mod, declines cxr, c/w bronchitis vs pna, for antibx course zpack, cough med prn,  to f/u  any worsening symptoms or concerns  Hyperglycemia Lab Results  Component Value Date   HGBA1C 5.9 09/25/2023   Stable, pt to continue current medical treatment  - diet, wt control  Followup: Return if symptoms worsen or fail to improve.  Lynwood Rush, MD 02/14/2024 1:54 PM [facility] [place] [facility] - [facility]     [1]  Allergies Allergen Reactions   Other Hives and Other (See Comments)   Penicillins     hives  [2]  Current Outpatient Medications on File Prior to Visit  Medication Sig Dispense Refill   albuterol  (VENTOLIN  HFA) 108 (90 Base) MCG/ACT inhaler INHALE 2 PUFFS BY MOUTH 4 TIMES A DAY AS NEEDED 8.5 each 2   celecoxib  (CELEBREX ) 200 MG capsule Take 1 capsule (200 mg total) by mouth 2 (two) times daily as needed. 180 capsule 1   levonorgestrel (MIRENA, 52 MG,) 20 MCG/DAY IUD Take 1 device by intrauterine route.     levothyroxine  (SYNTHROID ) 50 MCG tablet Take 1 tablet (50 mcg total) by mouth daily. 90 tablet 3   VITAMIN D , ERGOCALCIFEROL , PO Take by mouth once a week. 10000 IU     No current facility-administered medications on file prior to visit.

## 2024-02-13 NOTE — Patient Instructions (Signed)
 Please take all new medication as prescribed - the antibiotic, and cough medicine as needed  Please continue all other medications as before, and refills have been done for the xanax  and adderall   Please have the pharmacy call with any other refills you may need.  Please continue your efforts at being more active, low cholesterol diet, and weight control.  Please keep your appointments with your specialists as you may have planned

## 2024-02-14 ENCOUNTER — Encounter: Payer: Self-pay | Admitting: Internal Medicine

## 2024-02-14 DIAGNOSIS — R058 Other specified cough: Secondary | ICD-10-CM | POA: Insufficient documentation

## 2024-02-14 NOTE — Assessment & Plan Note (Signed)
 Lab Results  Component Value Date   HGBA1C 5.9 09/25/2023   Stable, pt to continue current medical treatment  - diet, wt control

## 2024-02-14 NOTE — Assessment & Plan Note (Signed)
 Last vitamin D  Lab Results  Component Value Date   VD25OH 40.35 09/25/2023   Stable, cont oral replacement

## 2024-02-14 NOTE — Assessment & Plan Note (Signed)
 Mild to mod, declines cxr, c/w bronchitis vs pna, for antibx course zpack, cough med prn,  to f/u any worsening symptoms or concerns

## 2024-02-16 ENCOUNTER — Telehealth: Payer: Self-pay

## 2024-02-16 MED ORDER — HYDROCODONE BIT-HOMATROP MBR 5-1.5 MG/5ML PO SOLN: 5.0000 mL | Freq: Four times a day (QID) | ORAL | 0 refills | Status: AC | PRN

## 2024-02-16 NOTE — Telephone Encounter (Signed)
 Copied from CRM #[id number]. Topic: Clinical - Prescription Issue >> Feb 16, 2024  1:15 PM Aleatha C wrote: Reason for CRM: Patient seen Dr Norleen Friday and didn't get call in medicaiton, needs cough medicine [facility] in [facility]  [id number] [place], [place] - [address] AT [facility] [address] [place] [address] Phone: [phone] Fax: [phone] Hours: Not open 24 hours

## 2024-02-16 NOTE — Telephone Encounter (Signed)
 Hello, I did send the hycodan on jan 9 to pharmacy but apparently it was not received  I have resent - done, thanks

## 2024-02-16 NOTE — Telephone Encounter (Signed)
 I should think the covering MD's in the office would help with this, but I would still keep the appt in June   Thanks

## 2024-02-16 NOTE — Telephone Encounter (Signed)
Spoke with patient, gave verbal understanding.

## 2024-02-16 NOTE — Addendum Note (Signed)
 Addended by: NORLEEN LYNWOOD ORN on: 02/16/2024 03:42 PM   Modules accepted: Orders

## 2024-02-16 NOTE — Telephone Encounter (Signed)
 Copied from CRM #[id number]. Topic: Clinical - Prescription Issue >> Feb 16, 2024  1:24 PM Aleatha C wrote: Reason for CRM: Patient has made a transfer appointment with Corean Ku for her soonest available in June, but Patient wants to know how would she go about get her prescription filled in the meantime once Dr Norleen had retired, she would need refills monthly on several medications please give her a call to further discuss and she states if she does not pick up please leave a detailed message on what she is to do call back # [phone]

## 2024-07-08 ENCOUNTER — Encounter: Admitting: Family Medicine
